# Patient Record
Sex: Female | Born: 1945 | Race: White | Hispanic: No | Smoking: Never smoker
Health system: Southern US, Community
[De-identification: ages and names within clinical notes are randomized; demographics above are authoritative.]

## PROBLEM LIST (undated history)

## (undated) DIAGNOSIS — F329 Major depressive disorder, single episode, unspecified: Secondary | ICD-10-CM

## (undated) DIAGNOSIS — E785 Hyperlipidemia, unspecified: Secondary | ICD-10-CM

## (undated) DIAGNOSIS — J189 Pneumonia, unspecified organism: Secondary | ICD-10-CM

## (undated) DIAGNOSIS — C801 Malignant (primary) neoplasm, unspecified: Secondary | ICD-10-CM

## (undated) DIAGNOSIS — M1990 Unspecified osteoarthritis, unspecified site: Secondary | ICD-10-CM

## (undated) DIAGNOSIS — I700 Atherosclerosis of aorta: Secondary | ICD-10-CM

## (undated) DIAGNOSIS — Z8719 Personal history of other diseases of the digestive system: Secondary | ICD-10-CM

## (undated) DIAGNOSIS — R0600 Dyspnea, unspecified: Secondary | ICD-10-CM

## (undated) DIAGNOSIS — I10 Essential (primary) hypertension: Secondary | ICD-10-CM

## (undated) DIAGNOSIS — E782 Mixed hyperlipidemia: Secondary | ICD-10-CM

## (undated) DIAGNOSIS — F32A Depression, unspecified: Secondary | ICD-10-CM

## (undated) DIAGNOSIS — H25019 Cortical age-related cataract, unspecified eye: Secondary | ICD-10-CM

## (undated) DIAGNOSIS — G4730 Sleep apnea, unspecified: Secondary | ICD-10-CM

## (undated) DIAGNOSIS — Z87442 Personal history of urinary calculi: Secondary | ICD-10-CM

## (undated) DIAGNOSIS — K219 Gastro-esophageal reflux disease without esophagitis: Secondary | ICD-10-CM

## (undated) DIAGNOSIS — K760 Fatty (change of) liver, not elsewhere classified: Secondary | ICD-10-CM

## (undated) DIAGNOSIS — J45909 Unspecified asthma, uncomplicated: Secondary | ICD-10-CM

## (undated) HISTORY — PX: NASAL SINUS SURGERY: SHX719

## (undated) HISTORY — PX: NISSEN FUNDOPLICATION: SHX2091

## (undated) HISTORY — PX: ABDOMINAL HYSTERECTOMY: SHX81

## (undated) HISTORY — PX: TOTAL KNEE ARTHROPLASTY: SHX125

## (undated) HISTORY — PX: COLONOSCOPY: SHX174

## (undated) HISTORY — PX: CARPAL TUNNEL RELEASE: SHX101

## (undated) HISTORY — PX: ESOPHAGOGASTRODUODENOSCOPY: SHX1529

## (undated) HISTORY — PX: KNEE ARTHROSCOPY: SUR90

## (undated) HISTORY — PX: CHOLECYSTECTOMY: SHX55

---

## 1968-03-19 HISTORY — PX: VAGINAL HYSTERECTOMY: SHX2639

## 2004-04-13 ENCOUNTER — Ambulatory Visit: Payer: Self-pay | Admitting: Internal Medicine

## 2005-04-26 ENCOUNTER — Ambulatory Visit: Payer: Self-pay | Admitting: Internal Medicine

## 2005-09-26 ENCOUNTER — Ambulatory Visit: Payer: Self-pay | Admitting: Internal Medicine

## 2005-12-21 ENCOUNTER — Ambulatory Visit: Payer: Self-pay | Admitting: Unknown Physician Specialty

## 2006-03-19 HISTORY — PX: BREAST BIOPSY: SHX20

## 2006-03-27 ENCOUNTER — Ambulatory Visit: Payer: Self-pay | Admitting: General Surgery

## 2006-10-01 ENCOUNTER — Ambulatory Visit: Payer: Self-pay | Admitting: Nurse Practitioner

## 2006-10-08 ENCOUNTER — Ambulatory Visit: Payer: Self-pay | Admitting: Nurse Practitioner

## 2006-12-11 ENCOUNTER — Ambulatory Visit: Payer: Self-pay | Admitting: Orthopaedic Surgery

## 2007-04-28 ENCOUNTER — Ambulatory Visit: Payer: Self-pay | Admitting: General Surgery

## 2007-10-21 ENCOUNTER — Ambulatory Visit: Payer: Self-pay | Admitting: General Surgery

## 2008-05-25 ENCOUNTER — Ambulatory Visit: Payer: Self-pay | Admitting: Family Medicine

## 2008-08-02 IMAGING — US ULTRASOUND RIGHT BREAST
1 series · 17 of 25 positions shown · non-contrast
Comparison: none

REASON FOR EXAM: right breast density   US if needed
COMMENTS:

[Series 1: ultrasound right breast · 17 of 36 slices shown]
[im 1/36]
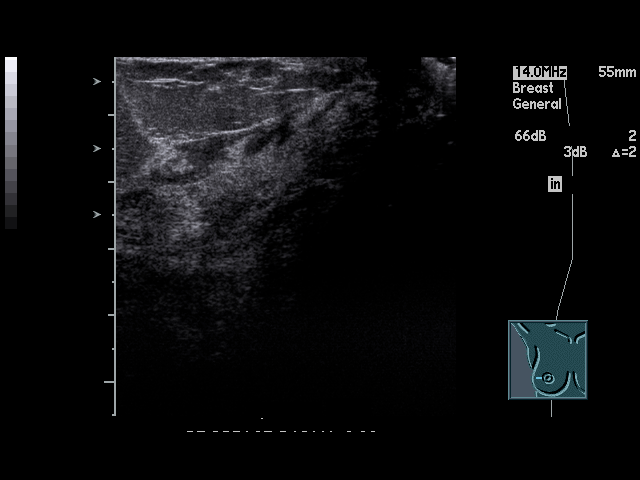
[im 3/36]
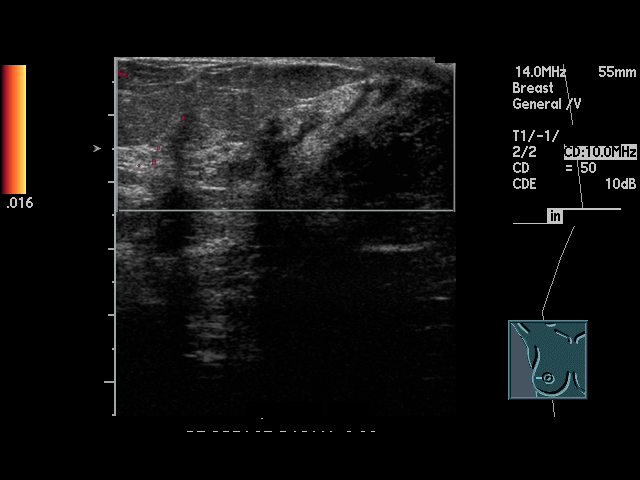
[im 5/36]
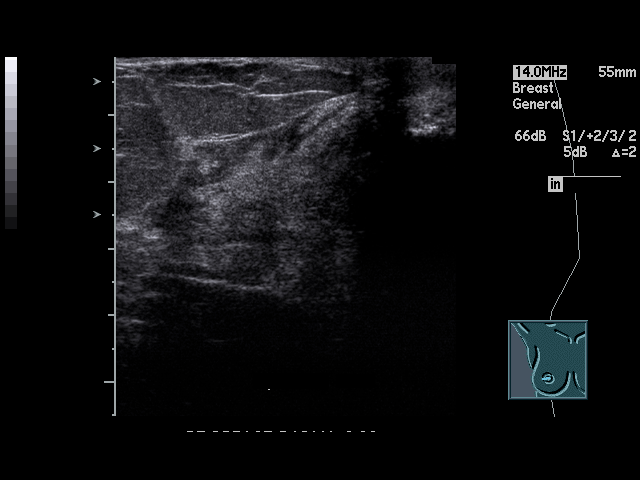
[im 8/36]
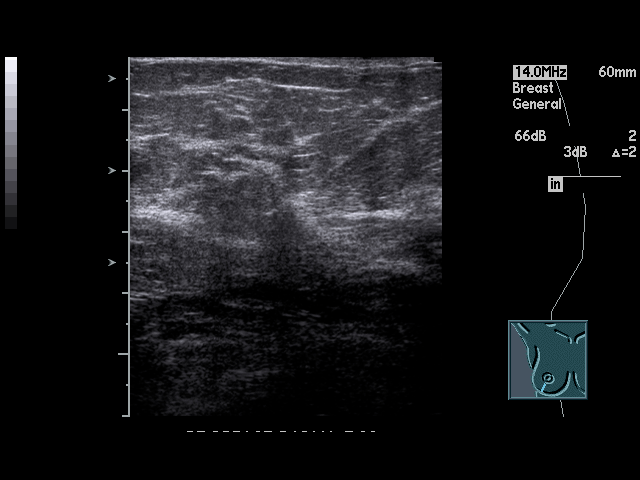
[im 9/36]
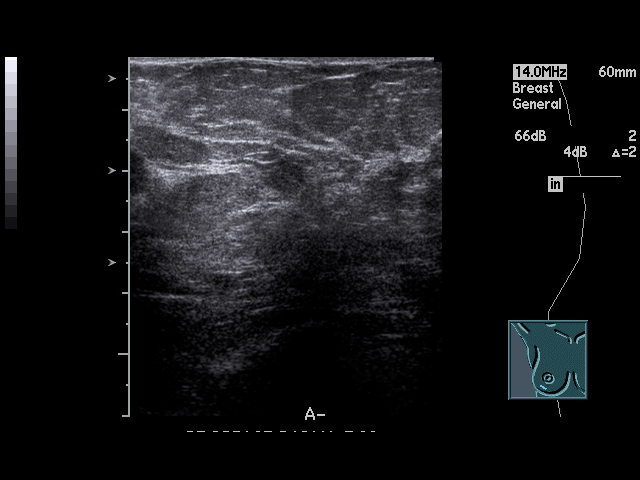
[im 12/36]
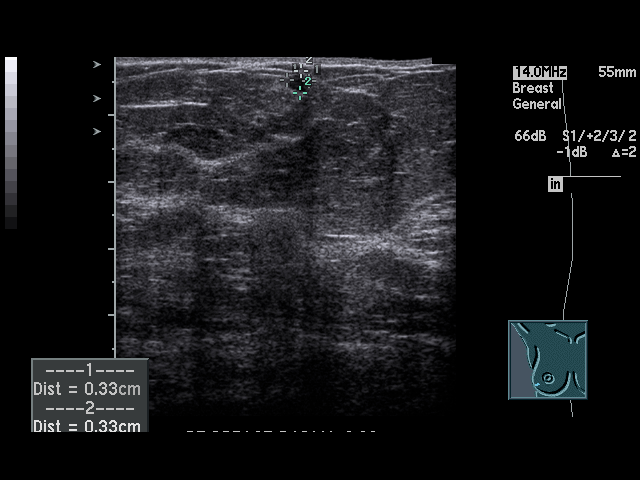
[im 14/36]
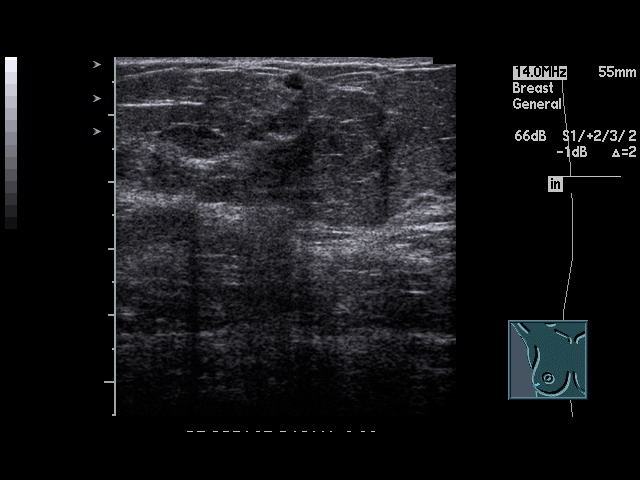
[im 17/36]
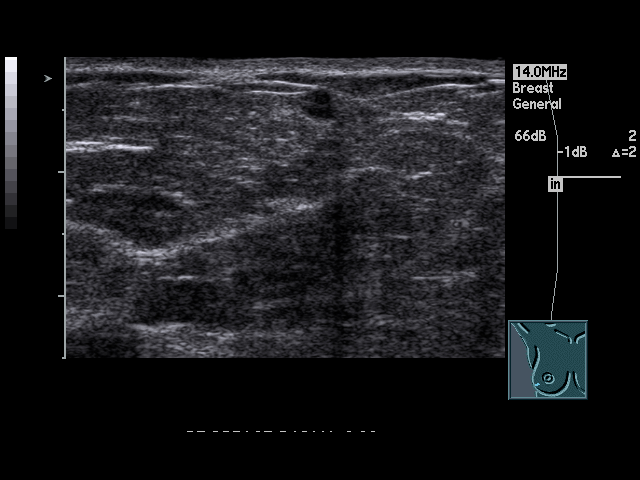
[im 18/36]
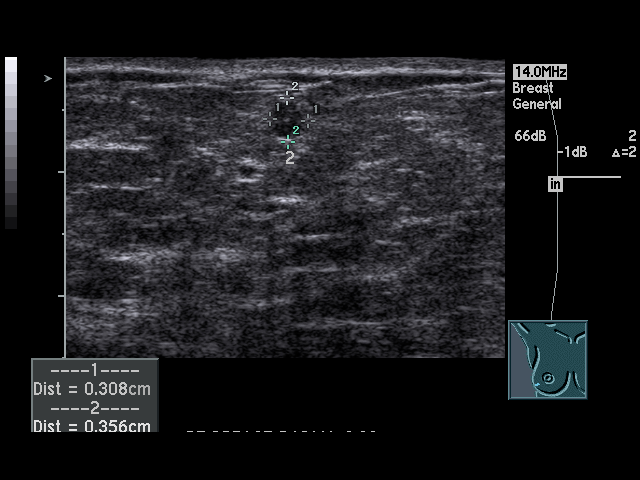
[im 19/36]
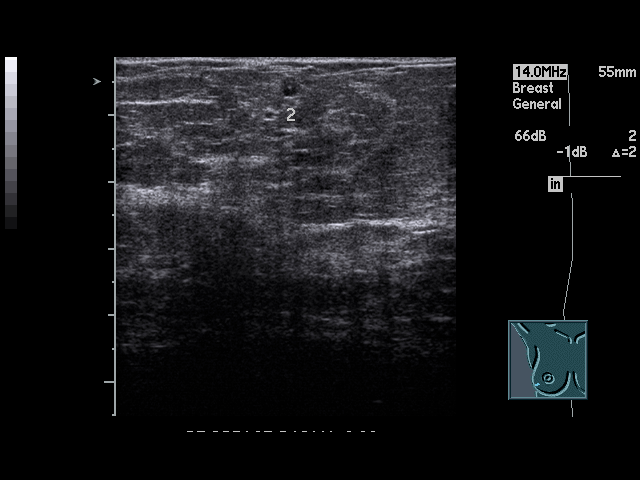
[im 22/36]
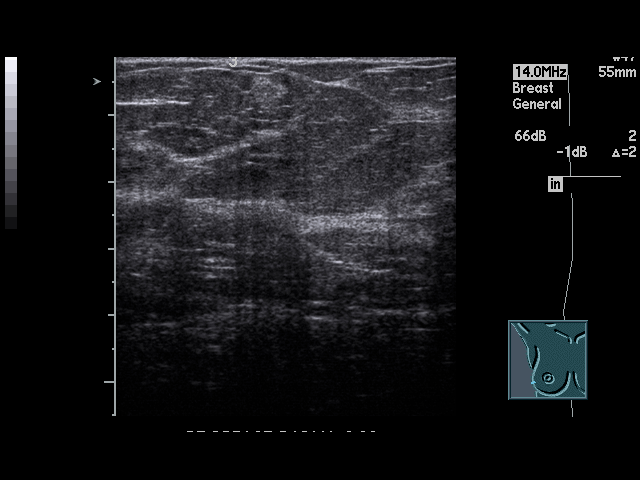
[im 24/36]
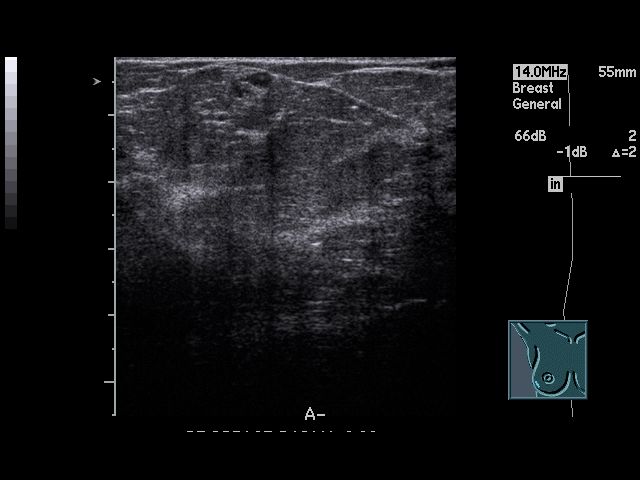
[im 27/36]
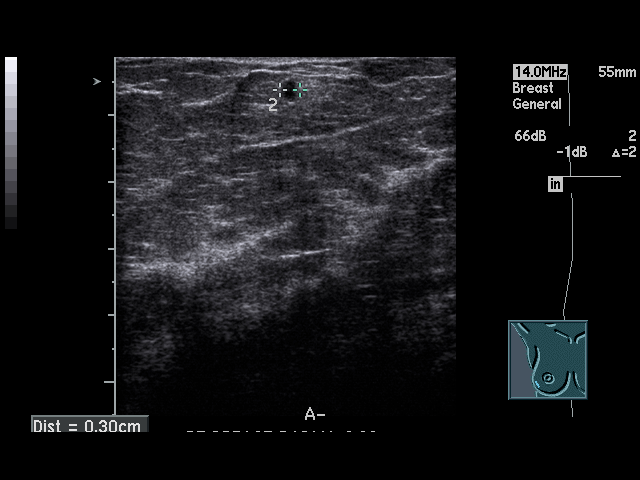
[im 28/36]
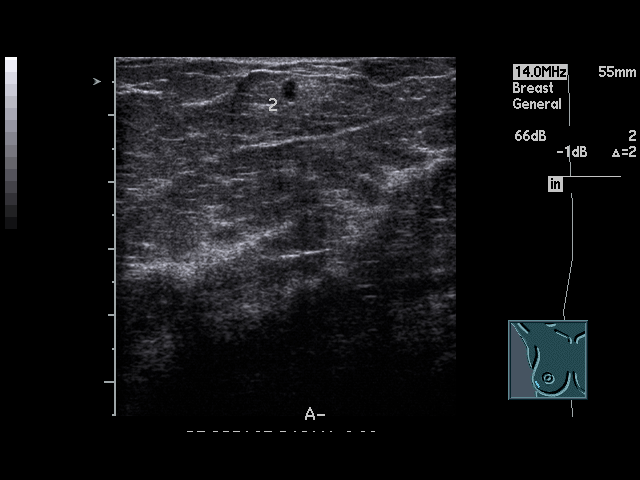
[im 31/36]
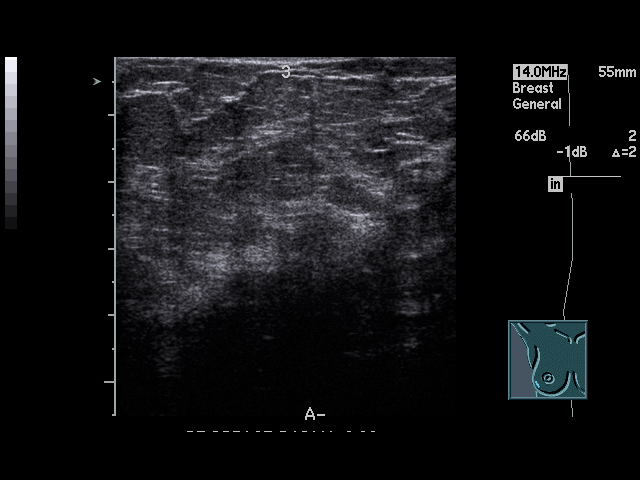
[im 33/36]
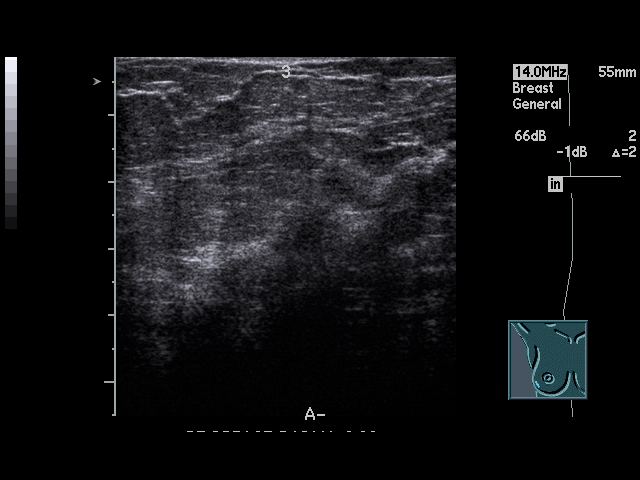
[im 36/36]
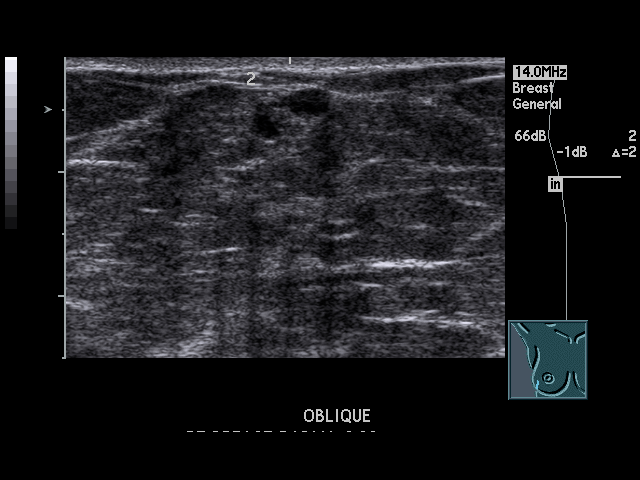

[17 of 25 positions shown; findings below may reference images not displayed]

PROCEDURE:     US  - US BREAST RIGHT  - October 08, 2006  [DATE]

RESULT:       The RIGHT breast was evaluated in the retroareolar region from
the 9 o'clock to the 7 o'clock position.  At the 8 o'clock position, a mixed
hypo and hyperechoic nodule is appreciated.  There appears to be three small
hypoechoic components of these nodules each measuring approximately 3.5 mm
in diameter. There appears to be flow appreciated with this region.  The
sonographic findings in this area are slightly ill-defined though suspicious
and surgical consultation is recommended.
IMPRESSION: Suspicious mixed hypo and echogenic nodules in the retroareolar portion of
the breast at 8 o'clock. Surgical consultation is recommended.

## 2008-10-25 ENCOUNTER — Ambulatory Visit: Payer: Self-pay | Admitting: Family Medicine

## 2008-11-08 ENCOUNTER — Ambulatory Visit: Payer: Self-pay | Admitting: Orthopedic Surgery

## 2009-10-31 ENCOUNTER — Ambulatory Visit: Payer: Self-pay | Admitting: Family Medicine

## 2010-03-13 ENCOUNTER — Ambulatory Visit: Payer: Self-pay | Admitting: Internal Medicine

## 2010-11-02 ENCOUNTER — Ambulatory Visit: Payer: Self-pay | Admitting: Family Medicine

## 2011-11-02 ENCOUNTER — Ambulatory Visit: Payer: Self-pay | Admitting: Family Medicine

## 2011-11-06 ENCOUNTER — Ambulatory Visit: Payer: Self-pay | Admitting: Family Medicine

## 2011-11-08 ENCOUNTER — Ambulatory Visit: Payer: Self-pay | Admitting: Family Medicine

## 2012-03-31 ENCOUNTER — Ambulatory Visit: Payer: Self-pay | Admitting: Unknown Physician Specialty

## 2012-05-13 ENCOUNTER — Ambulatory Visit: Payer: Self-pay | Admitting: Family Medicine

## 2012-07-19 ENCOUNTER — Ambulatory Visit: Payer: Self-pay | Admitting: Family Medicine

## 2012-07-25 ENCOUNTER — Ambulatory Visit: Payer: Self-pay | Admitting: Family Medicine

## 2013-05-26 DIAGNOSIS — K760 Fatty (change of) liver, not elsewhere classified: Secondary | ICD-10-CM | POA: Insufficient documentation

## 2013-07-21 ENCOUNTER — Ambulatory Visit: Payer: Self-pay | Admitting: Unknown Physician Specialty

## 2013-08-06 ENCOUNTER — Ambulatory Visit: Payer: Self-pay | Admitting: Family Medicine

## 2014-08-10 ENCOUNTER — Ambulatory Visit: Payer: Medicare PPO

## 2014-08-10 ENCOUNTER — Ambulatory Visit
Admission: EM | Admit: 2014-08-10 | Discharge: 2014-08-10 | Disposition: A | Discharge status: Home / Self Care | Payer: Medicare PPO | Attending: Family Medicine | Admitting: Family Medicine

## 2014-08-10 DIAGNOSIS — Z882 Allergy status to sulfonamides status: Secondary | ICD-10-CM | POA: Diagnosis not present

## 2014-08-10 DIAGNOSIS — J0100 Acute maxillary sinusitis, unspecified: Secondary | ICD-10-CM | POA: Insufficient documentation

## 2014-08-10 DIAGNOSIS — R0789 Other chest pain: Secondary | ICD-10-CM | POA: Diagnosis not present

## 2014-08-10 DIAGNOSIS — I10 Essential (primary) hypertension: Secondary | ICD-10-CM | POA: Diagnosis not present

## 2014-08-10 DIAGNOSIS — M940 Chondrocostal junction syndrome [Tietze]: Secondary | ICD-10-CM | POA: Diagnosis not present

## 2014-08-10 DIAGNOSIS — F329 Major depressive disorder, single episode, unspecified: Secondary | ICD-10-CM | POA: Diagnosis not present

## 2014-08-10 DIAGNOSIS — R51 Headache: Secondary | ICD-10-CM | POA: Diagnosis present

## 2014-08-10 DIAGNOSIS — R079 Chest pain, unspecified: Secondary | ICD-10-CM | POA: Insufficient documentation

## 2014-08-10 DIAGNOSIS — E785 Hyperlipidemia, unspecified: Secondary | ICD-10-CM | POA: Diagnosis not present

## 2014-08-10 HISTORY — DX: Essential (primary) hypertension: I10

## 2014-08-10 HISTORY — DX: Major depressive disorder, single episode, unspecified: F32.9

## 2014-08-10 HISTORY — DX: Depression, unspecified: F32.A

## 2014-08-10 HISTORY — DX: Hyperlipidemia, unspecified: E78.5

## 2014-08-10 LAB — CBC WITH DIFFERENTIAL/PLATELET
BASOS ABS: 0 10*3/uL (ref 0–0.1)
BASOS PCT: 1 %
EOS PCT: 3 %
Eosinophils Absolute: 0.2 10*3/uL (ref 0–0.7)
HCT: 41.4 % (ref 35.0–47.0)
Hemoglobin: 13.8 g/dL (ref 12.0–16.0)
LYMPHS ABS: 1 10*3/uL (ref 1.0–3.6)
Lymphocytes Relative: 17 %
MCH: 29.6 pg (ref 26.0–34.0)
MCHC: 33.3 g/dL (ref 32.0–36.0)
MCV: 89 fL (ref 80.0–100.0)
MONOS PCT: 10 %
Monocytes Absolute: 0.6 10*3/uL (ref 0.2–0.9)
NEUTROS PCT: 69 %
Neutro Abs: 4.3 10*3/uL (ref 1.4–6.5)
PLATELETS: 217 10*3/uL (ref 150–440)
RBC: 4.65 MIL/uL (ref 3.80–5.20)
RDW: 13.1 % (ref 11.5–14.5)
WBC: 6.1 10*3/uL (ref 3.6–11.0)

## 2014-08-10 LAB — CK TOTAL AND CKMB (NOT AT ARMC)
CK TOTAL: 39 U/L (ref 38–234)
CK, MB: 0.9 ng/mL (ref 0.5–5.0)
RELATIVE INDEX: INVALID (ref 0.0–2.5)

## 2014-08-10 LAB — COMPREHENSIVE METABOLIC PANEL
ALT: 21 U/L (ref 14–54)
AST: 21 U/L (ref 15–41)
Albumin: 4.1 g/dL (ref 3.5–5.0)
Alkaline Phosphatase: 62 U/L (ref 38–126)
Anion gap: 9 (ref 5–15)
BILIRUBIN TOTAL: 0.5 mg/dL (ref 0.3–1.2)
BUN: 19 mg/dL (ref 6–20)
CO2: 23 mmol/L (ref 22–32)
Calcium: 9.3 mg/dL (ref 8.9–10.3)
Chloride: 106 mmol/L (ref 101–111)
Creatinine, Ser: 0.67 mg/dL (ref 0.44–1.00)
GFR calc Af Amer: >60 mL/min (ref >60)
GFR calc non Af Amer: >60 mL/min (ref >60)
Glucose, Bld: 117 mg/dL — ABNORMAL HIGH (ref 65–99)
Potassium: 3.8 mmol/L (ref 3.5–5.1)
SODIUM: 138 mmol/L (ref 135–145)
Total Protein: 6.9 g/dL (ref 6.5–8.1)

## 2014-08-10 LAB — TROPONIN I: Troponin I: <0.03 ng/mL (ref <0.031)

## 2014-08-10 MED ORDER — FLUTICASONE PROPIONATE 50 MCG/ACT NA SUSP: 2.0000 | Freq: Every day | NASAL | Status: DC

## 2014-08-10 MED ORDER — KETOROLAC TROMETHAMINE 60 MG/2ML IM SOLN
60.0000 mg | Freq: Once | INTRAMUSCULAR | Status: AC
  Administered 2014-08-10: 60 mg via INTRAMUSCULAR

## 2014-08-10 MED ORDER — FEXOFENADINE-PSEUDOEPHED ER 180-240 MG PO TB24: 1.0000 | ORAL_TABLET | Freq: Every day | ORAL | Status: DC

## 2014-08-10 MED ORDER — LEVOFLOXACIN 500 MG PO TABS: 500.0000 mg | ORAL_TABLET | Freq: Every day | ORAL | Status: DC

## 2014-08-10 MED ORDER — MELOXICAM 15 MG PO TABS: 15.0000 mg | ORAL_TABLET | Freq: Every day | ORAL | Status: DC

## 2014-08-10 NOTE — Discharge Instructions (Signed)
If chest pain recurs or continues please go to the nearest emergency room of your choice for further evaluation. Would recommend strongly in 1-2 weeks see PCP and get scheduled for a stress test and possible echocardiogram just to make sure there is no signs of any significant cardiac disease even though all the tests here were negative. Chest Pain (Nonspecific) It is often hard to give a diagnosis for the cause of chest pain. There is always a chance that your pain could be related to something serious, such as a heart attack or a blood clot in the lungs. You need to follow up with your doctor. HOME CARE  If antibiotic medicine was given, take it as directed by your doctor. Finish the medicine even if you start to feel better.  For the next few days, avoid activities that bring on chest pain. Continue physical activities as told by your doctor.  Do not use any tobacco products. This includes cigarettes, chewing tobacco, and e-cigarettes.  Avoid drinking alcohol.  Only take medicine as told by your doctor.  Follow your doctor's suggestions for more testing if your chest pain does not go away.  Keep all doctor visits you made. GET HELP IF:  Your chest pain does not go away, even after treatment.  You have a rash with blisters on your chest.  You have a fever. GET HELP RIGHT AWAY IF:   You have more pain or pain that spreads to your arm, neck, jaw, back, or belly (abdomen).  You have shortness of breath.  You cough more than usual or cough up blood.  You have very bad back or belly pain.  You feel sick to your stomach (nauseous) or throw up (vomit).  You have very bad weakness.  You pass out (faint).  You have chills. This is an emergency. Do not wait to see if the problems will go away. Call your local emergency services (911 in [place] Do not drive yourself to the hospital. MAKE SURE YOU:   Understand these instructions.  Will watch your condition.  Will get help right  away if you are not doing well or get worse. Document Released: 08/22/2007 Document Revised: 03/10/2013 Document Reviewed: 08/22/2007 [facility] Patient Information 2015 [place], [place]. This information is not intended to replace advice given to you by your health care provider. Make sure you discuss any questions you have with your health care provider.  Chest Wall Pain Chest wall pain is pain felt in or around the chest bones and muscles. It may take up to 6 weeks to get better. It may take longer if you are active. Chest wall pain can happen on its own. Other times, things like germs, injury, coughing, or exercise can cause the pain. HOME CARE   Avoid activities that make you tired or cause pain. Try not to use your chest, belly (abdominal), or side muscles. Do not use heavy weights.  Put ice on the sore area.  Put ice in a plastic bag.  Place a towel between your skin and the bag.  Leave the ice on for 15-20 minutes for the first 2 days.  Only take medicine as told by your doctor. GET HELP RIGHT AWAY IF:   You have more pain or are very uncomfortable.  You have a fever.  Your chest pain gets worse.  You have new problems.  You feel sick to your stomach (nauseous) or throw up (vomit).  You start to sweat or feel lightheaded.  You have a  cough with mucus (phlegm).  You cough up blood. MAKE SURE YOU:   Understand these instructions.  Will watch your condition.  Will get help right away if you are not doing well or get worse. Document Released: 08/22/2007 Document Revised: 05/28/2011 Document Reviewed: 10/30/2010 [facility] Patient Information 2015 [place], [place]. This information is not intended to replace advice given to you by your health care provider. Make sure you discuss any questions you have with your health care provider. Sinusitis Sinusitis is redness, soreness, and puffiness (inflammation) of the air pockets in the bones of your face (sinuses). The redness,  soreness, and puffiness can cause air and mucus to get trapped in your sinuses. This can allow germs to grow and cause an infection.  HOME CARE   Drink enough fluids to keep your pee (urine) clear or pale yellow.  Use a humidifier in your home.  Run a hot shower to create steam in the bathroom. Sit in the bathroom with the door closed. Breathe in the steam 3-4 times a day.  Put a warm, moist washcloth on your face 3-4 times a day, or as told by your doctor.  Use salt water sprays (saline sprays) to wet the thick fluid in your nose. This can help the sinuses drain.  Only take medicine as told by your doctor. GET HELP RIGHT AWAY IF:   Your pain gets worse.  You have very bad headaches.  You are sick to your stomach (nauseous).  You throw up (vomit).  You are very sleepy (drowsy) all the time.  Your face is puffy (swollen).  Your vision changes.  You have a stiff neck.  You have trouble breathing. MAKE SURE YOU:   Understand these instructions.  Will watch your condition.  Will get help right away if you are not doing well or get worse. Document Released: 08/22/2007 Document Revised: 11/28/2011 Document Reviewed: 10/09/2011 [facility] Patient Information 2015 [place], [place]. This information is not intended to replace advice given to you by your health care provider. Make sure you discuss any questions you have with your health care provider.

## 2014-08-10 NOTE — ED Notes (Signed)
Complaints of left face pain x 1 week and chest pain x 1 week. Face pain describes as pressure pain, constant, no vision changes from baseline. CP localized in center chest, non-tender on palpation, non-radiating, no SOB, but movement makes it worst.

## 2014-08-10 NOTE — ED Provider Notes (Signed)
CSN: [id number]     Arrival date & time 08/10/14  0076 History   First MD Initiated Contact with Patient 08/10/14 [phone]     Chief Complaint  Patient presents with  . Facial Pain  . Chest Pain   (Consider location/radiation/quality/duration/timing/severity/associated sxs/prior Treatment) Patient is a 69 y.o. female presenting with chest pain and URI. The history is provided by the patient. No language interpreter was used.  Chest Pain Pain location:  Substernal area Pain quality: sharp and stabbing   Pain quality: not burning, not crushing, no pressure, not radiating, not shooting and no tightness   Pain radiates to:  Does not radiate Pain radiates to the back: no   Pain severity:  Severe Onset quality:  Sudden Duration:  1 week Timing:  Sporadic Progression:  Waxing and waning Chronicity:  New Context: breathing, lifting and movement   Relieved by:  Certain positions Worsened by:  Deep breathing and movement Ineffective treatments:  None tried Associated symptoms: cough   Associated symptoms comment:  She reports having sinus trouble for almost a month. Initially green discharge from nostril now clear with feels as if it socked in on the left side. Risk factors: hypertension   Risk factors: no birth control, no coronary artery disease and not female   URI Presenting symptoms: cough, facial pain and rhinorrhea   Severity:  Severe Onset quality: About one month. Timing:  Constant Progression:  Worsening Chronicity:  New Relieved by:  Nothing Ineffective treatments:  None tried Associated symptoms: sinus pain and sneezing   Risk factors: being elderly    reports family history of heart disease and a stress test that she had done about 2 years ago which was unremarkable other than her being a poor physical shape.  Past Medical History  Diagnosis Date  . Hypertension   . Hyperlipidemia   . Depression    Past Surgical History  Procedure Laterality Date  . Abdominal  hysterectomy    . Cholecystectomy    . Nasal sinus surgery    . Total knee arthroplasty     History reviewed. No pertinent family history. History  Substance Use Topics  . Smoking status: Never Smoker   . Smokeless tobacco: Not on file  . Alcohol Use: No   OB History    No data available     Review of Systems  HENT: Positive for rhinorrhea and sneezing.   Respiratory: Positive for cough.   Cardiovascular: Positive for chest pain.  All other systems reviewed and are negative.   Allergies  Doxycycline; Keflex; Penicillins; and Sulfa antibiotics  Home Medications   Prior to Admission medications   Medication Sig Start Date End Date Taking? Authorizing Provider  atorvastatin (LIPITOR) 10 MG tablet Take 10 mg by mouth daily.   Yes Historical Provider, MD  DULoxetine (CYMBALTA) 60 MG capsule Take 60 mg by mouth daily.   Yes Historical Provider, MD  hydrALAZINE (APRESOLINE) 25 MG tablet Take 25 mg by mouth 3 (three) times daily.   Yes Historical Provider, MD  losartan (COZAAR) 100 MG tablet Take 100 mg by mouth daily.   Yes Historical Provider, MD  traZODone (DESYREL) 150 MG tablet Take by mouth at bedtime.   Yes Historical Provider, MD  fexofenadine-pseudoephedrine (ALLEGRA-D ALLERGY & CONGESTION) 180-240 MG per 24 hr tablet Take 1 tablet by mouth daily. 08/10/14   Frederich Cha, MD  fluticasone (FLONASE) 50 MCG/ACT nasal spray Place 2 sprays into both nostrils daily. 08/10/14   Frederich Cha, MD  levofloxacin (  LEVAQUIN) 500 MG tablet Take 1 tablet (500 mg total) by mouth daily. 08/10/14   Frederich Cha, MD  meloxicam (MOBIC) 15 MG tablet Take 1 tablet (15 mg total) by mouth daily. 08/10/14   Frederich Cha, MD   BP 138/66 mmHg  Pulse 74  Temp(Src) 97.7 F (36.5 C) (Tympanic)  Resp 18  Ht 5\' 5"  (1.651 m)  Wt 263 lb (119.296 kg)  BMI 43.77 kg/m2  SpO2 96% Physical Exam  Constitutional: She is oriented to person, place, and time. She appears well-developed and well-nourished.  Obese  white female in no distress  HENT:  Head: Normocephalic.  Eyes: Pupils are equal, round, and reactive to light.  Neck: Normal range of motion. Neck supple.  Cardiovascular: Normal rate, regular rhythm, S1 normal and S2 normal.  Exam reveals no distant heart sounds and no friction rub.   No murmur heard. Pulmonary/Chest: Effort normal and breath sounds normal. No respiratory distress. She has no wheezes. She exhibits tenderness and bony tenderness. She exhibits no laceration.    Abdominal: Soft.  Musculoskeletal: Normal range of motion. She exhibits no edema.  Neurological: She is alert and oriented to person, place, and time.  Skin: Skin is warm.  Psychiatric: She has a normal mood and affect.   EKG was obtained which showed normal sinus rhythm at a ventricular rate of 67. ED Course  Procedures (including critical care time) Labs Review Labs Reviewed  COMPREHENSIVE METABOLIC PANEL - Abnormal; Notable for the following:    Glucose, Bld 117 (*)    All other components within normal limits  CBC WITH DIFFERENTIAL/PLATELET  CK TOTAL AND CKMB  TROPONIN I   Results for orders placed or performed during the hospital encounter of 08/10/14  Comprehensive metabolic panel  Result Value Ref Range   Sodium 138 135 - 145 mmol/L   Potassium 3.8 3.5 - 5.1 mmol/L   Chloride 106 101 - 111 mmol/L   CO2 23 22 - 32 mmol/L   Glucose, Bld 117 (H) 65 - 99 mg/dL   BUN 19 6 - 20 mg/dL   Creatinine, Ser 0.67 0.44 - 1.00 mg/dL   Calcium 9.3 8.9 - 10.3 mg/dL   Total Protein 6.9 6.5 - 8.1 g/dL   Albumin 4.1 3.5 - 5.0 g/dL   AST 21 15 - 41 U/L   ALT 21 14 - 54 U/L   Alkaline Phosphatase 62 38 - 126 U/L   Total Bilirubin 0.5 0.3 - 1.2 mg/dL   GFR calc non Af Amer >60 >60 mL/min   GFR calc Af Amer >60 >60 mL/min   Anion gap 9 5 - 15  CBC with Differential  Result Value Ref Range   WBC 6.1 3.6 - 11.0 K/uL   RBC 4.65 3.80 - 5.20 MIL/uL   Hemoglobin 13.8 12.0 - 16.0 g/dL   HCT 41.4 35.0 - 47.0 %    MCV 89.0 80.0 - 100.0 fL   MCH 29.6 26.0 - 34.0 pg   MCHC 33.3 32.0 - 36.0 g/dL   RDW 13.1 11.5 - 14.5 %   Platelets 217 150 - 440 K/uL   Neutrophils Relative % 69 %   Neutro Abs 4.3 1.4 - 6.5 K/uL   Lymphocytes Relative 17 %   Lymphs Abs 1.0 1.0 - 3.6 K/uL   Monocytes Relative 10 %   Monocytes Absolute 0.6 0.2 - 0.9 K/uL   Eosinophils Relative 3 %   Eosinophils Absolute 0.2 0 - 0.7 K/uL   Basophils Relative 1 %  Basophils Absolute 0.0 0 - 0.1 K/uL  CK total and CKMB (cardiac)  Result Value Ref Range   Total CK 39 38 - 234 U/L   CK, MB 0.9 0.5 - 5.0 ng/mL   Relative Index RELATIVE INDEX IS INVALID 0.0 - 2.5  Troponin I  Result Value Ref Range   Troponin I <0.03 <0.031 ng/mL    Imaging Review Dg Chest 2 View  08/10/2014   CLINICAL DATA:  One week history of right upper anterior chest pain with productive cough  EXAM: CHEST  2 VIEW  COMPARISON:  None.  FINDINGS: There is no edema or consolidation. Heart size and pulmonary vascularity are normal. No adenopathy. There is mild degenerative change in the thoracic spine.  IMPRESSION: No edema or consolidation.   Electronically Signed   By: Lowella Grip III M.D.   On: 08/10/2014 09:39   as I discussed with patient this appears to be costochondritis. I have also placed the patient that if this had not been ongoing for week previously normal would have sent her to the ED for full cardiac evaluation. Toradol injection offered to see if the pain was improved. MDM   1. Other chest pain   2. Costochondritis, acute   3. Acute maxillary sinusitis, recurrence not specified        Frederich Cha, MD 08/10/14 1539

## 2014-08-27 DIAGNOSIS — I10 Essential (primary) hypertension: Secondary | ICD-10-CM | POA: Insufficient documentation

## 2015-01-10 ENCOUNTER — Other Ambulatory Visit: Payer: Self-pay | Admitting: Family Medicine

## 2015-01-10 DIAGNOSIS — Z1231 Encounter for screening mammogram for malignant neoplasm of breast: Secondary | ICD-10-CM

## 2015-01-13 ENCOUNTER — Ambulatory Visit
Admission: RE | Admit: 2015-01-13 | Discharge: 2015-01-13 | Disposition: A | Discharge status: Home / Self Care | Payer: Medicare PPO | Source: Ambulatory Visit | Attending: Family Medicine | Admitting: Family Medicine

## 2015-01-13 DIAGNOSIS — Z1231 Encounter for screening mammogram for malignant neoplasm of breast: Secondary | ICD-10-CM | POA: Insufficient documentation

## 2015-05-16 IMAGING — CT CT ABD-PELV W/ CM
2 of 5 series · 17 of 46 positions shown, 19 images · IV contrast (isovue)
Comparison: CT ABD-PELV W/O CM dated 11/02/2011

CLINICAL DATA: Right flank pain.  History of kidney stones.

EXAM:
CT ABDOMEN AND PELVIS WITH CONTRAST
TECHNIQUE: Multidetector CT imaging of the abdomen and pelvis was performed
using the standard protocol following bolus administration of
intravenous contrast.
CONTRAST:  100 mL Isovue 370.

[Series 2: axial soft tissue · axial · 0.78mm/px · z∈[-512,-112]mm · 14 of 90 slices shown, 16 images]
[im 5/90  soft-tissue]
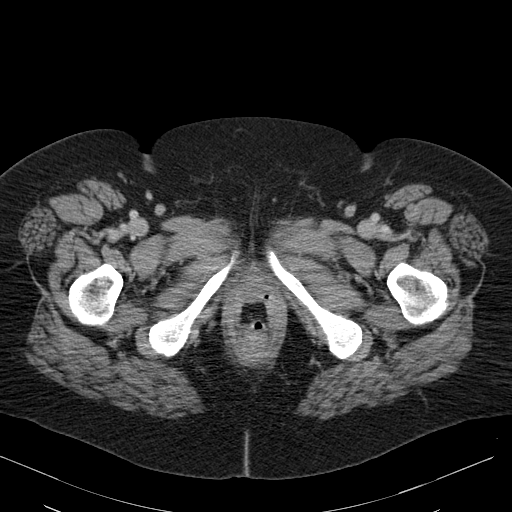
[im 5/90  bone]
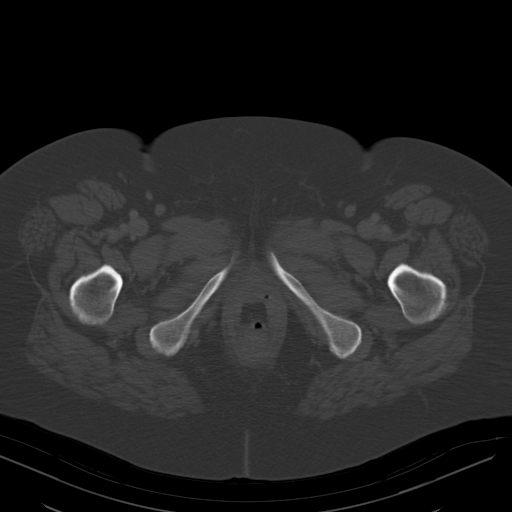
[im 14/90  soft-tissue]
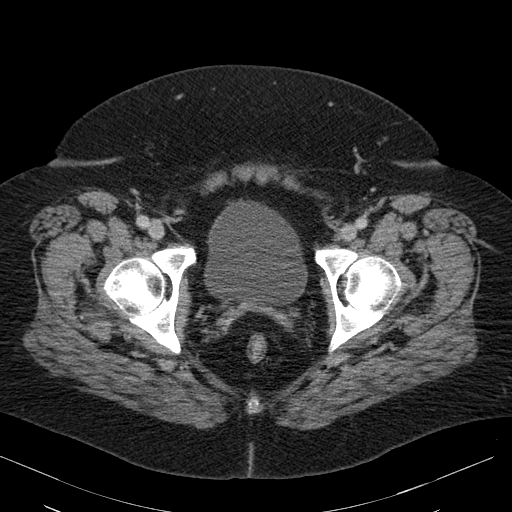
[im 18/90  soft-tissue]
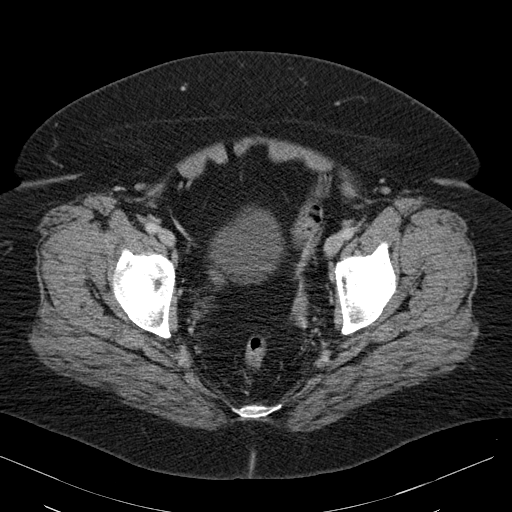
[im 23/90  soft-tissue]
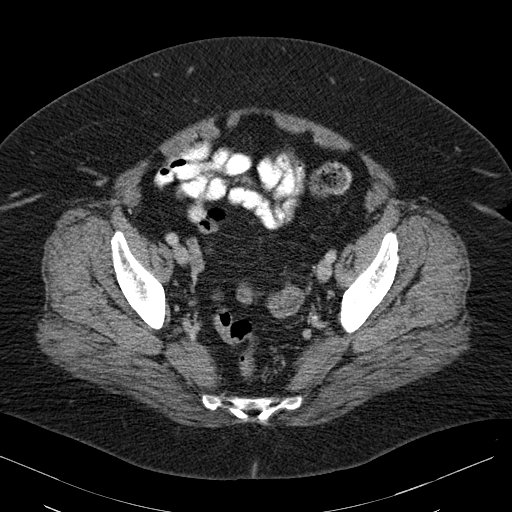
[im 32/90  soft-tissue]
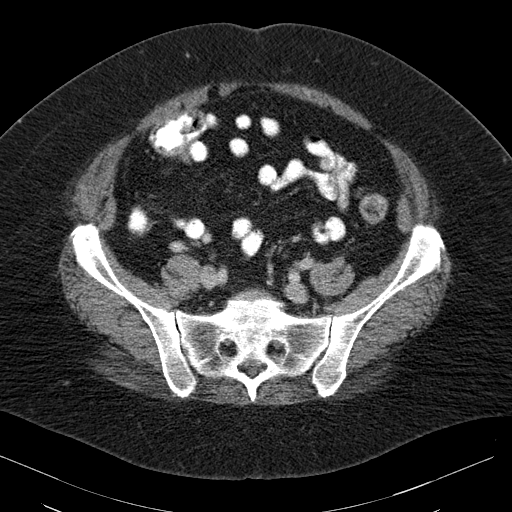
[im 36/90  soft-tissue]
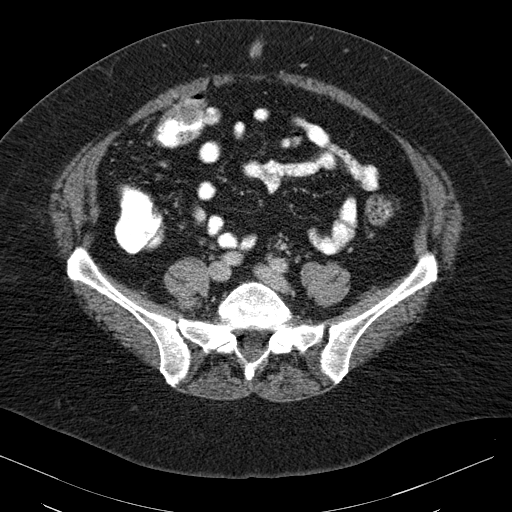
[im 41/90  soft-tissue]
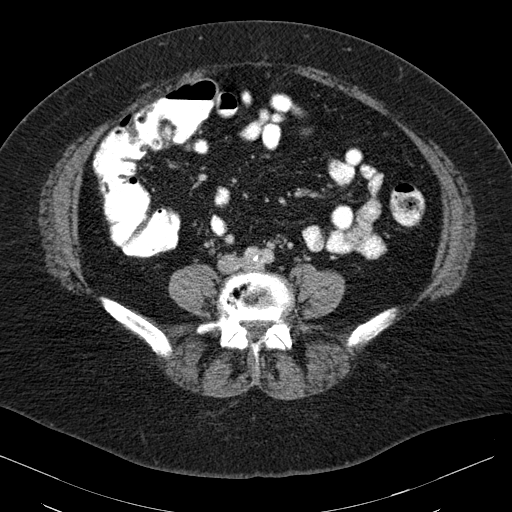
[im 49/90  soft-tissue]
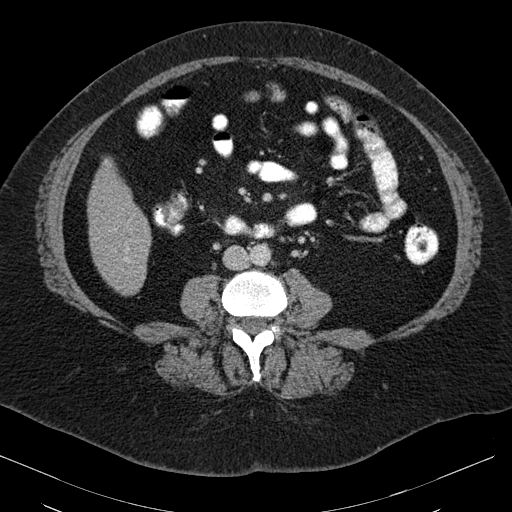
[im 54/90  soft-tissue]
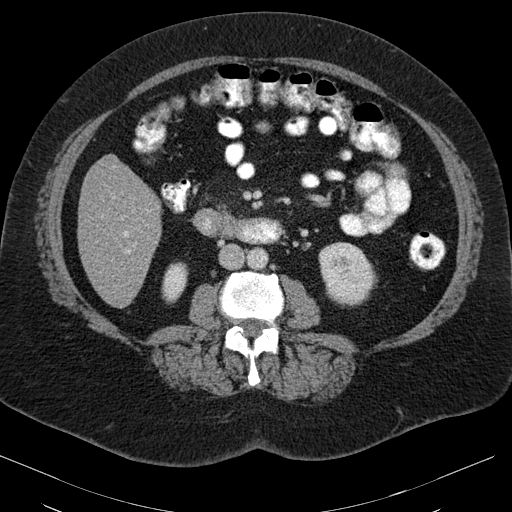
[im 54/90  bone]
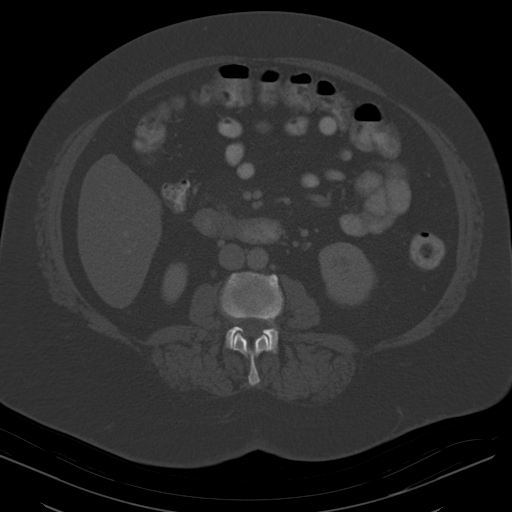
[im 58/90  soft-tissue]
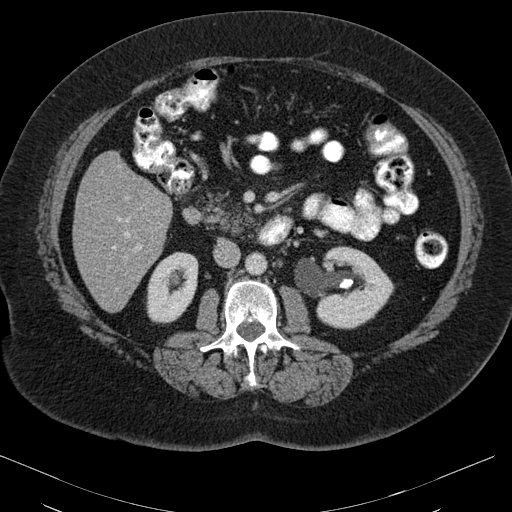
[im 67/90  soft-tissue]
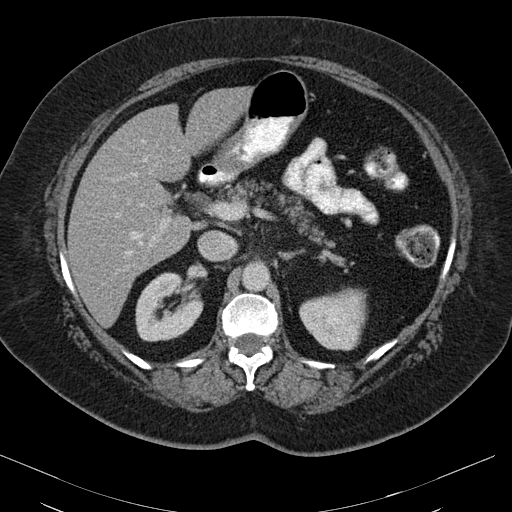
[im 72/90  soft-tissue]
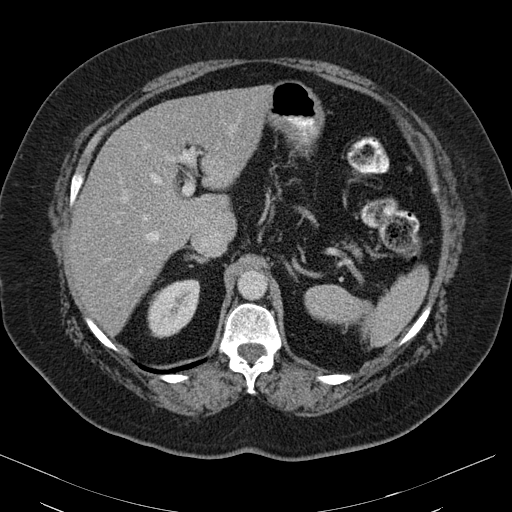
[im 76/90  soft-tissue]
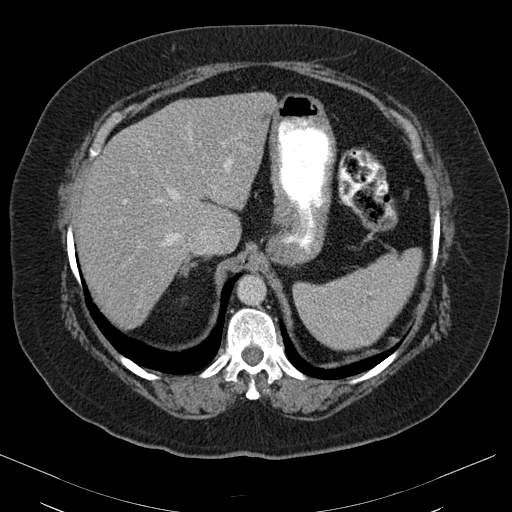
[im 85/90  soft-tissue]
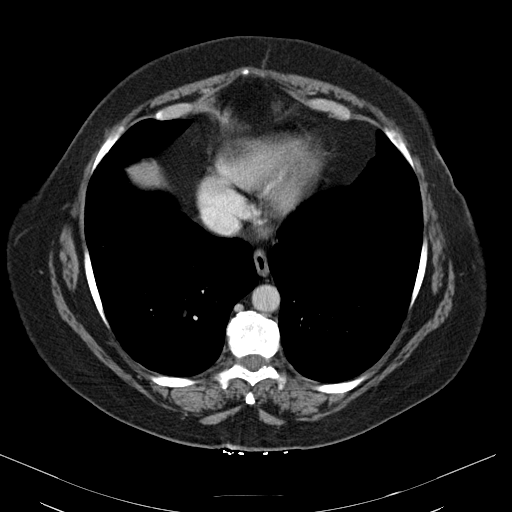

[Series 602: coronal · coronal · 0.88mm/px · 3 of 159 slices shown]
[im 53/159  soft-tissue]
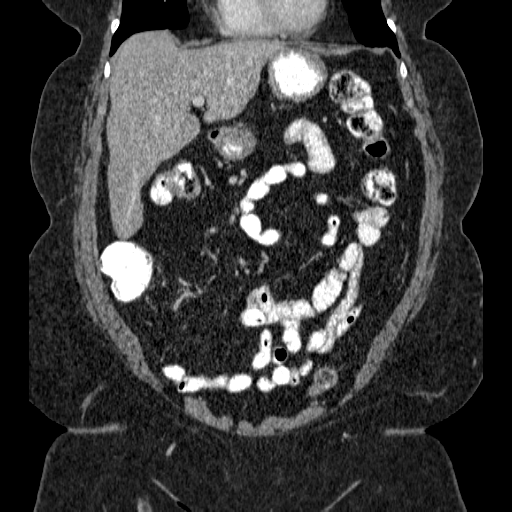
[im 71/159  soft-tissue]
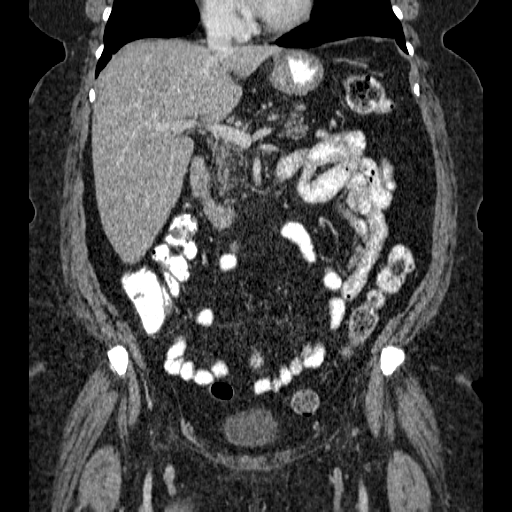
[im 88/159  soft-tissue]
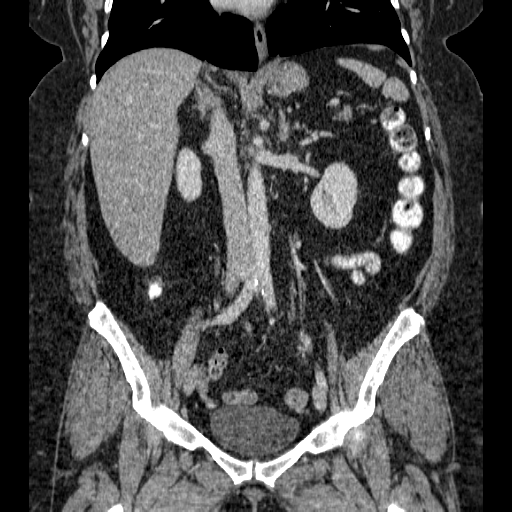

[17 of 46 positions shown; findings below may reference images not displayed]

FINDINGS: Bones:  No aggressive osseous lesions.  Lumbar spondylosis.

Lung Bases: Scattered areas of nonspecific subpleural ground-glass
attenuation without fibrosis. No focal consolidation.

Liver: 4 mm low-density lesion in the subcapsular left hepatic lobe
probably represents a cyst but is too small to characterize (image
16 series 2).

Spleen:  Normal.

Gallbladder:  Surgically absent.

Common bile duct:  Normal.

Pancreas:  Normal.

Adrenal glands:  Normal bilaterally.

Kidneys: Normal enhancement and delayed excretion of contrast. Right
ureter appears normal. Punctate right inferior renal pole collecting
system calculus measuring 2 mm, unchanged from prior.

The left kidney shows a nonobstructing interpolar calculus measuring
11 mm x 6 mm on axial imaging and 12 mm craniocaudal. Smaller
interpolar calculus measures 6 mm, also nonobstructing. No ureteral
calculi. Extrarenal pelvis incidentally noted.

Stomach:  Normal.

Small bowel:  Normal.  No mesenteric adenopathy or obstruction.

Colon:   Normal.

Pelvic Genitourinary: Hysterectomy. Urinary bladder appears normal.
No free fluid. Both ovaries appear normal for age.

Vasculature: Mild atherosclerosis.

Body Wall: Normal.
IMPRESSION: 1. Bilateral nonobstructing renal collecting system calculi with the
largest on the left measuring 12 mm long axis. Compared to prior, no
interval change.
2. Cholecystectomy and hysterectomy.

## 2015-11-18 ENCOUNTER — Ambulatory Visit
Admission: EM | Admit: 2015-11-18 | Discharge: 2015-11-18 | Disposition: A | Discharge status: Home / Self Care | Payer: Medicare Other | Attending: Emergency Medicine | Admitting: Emergency Medicine

## 2015-11-18 ENCOUNTER — Encounter: Payer: Self-pay | Admitting: Emergency Medicine

## 2015-11-18 DIAGNOSIS — H81399 Other peripheral vertigo, unspecified ear: Secondary | ICD-10-CM

## 2015-11-18 LAB — URINALYSIS COMPLETE WITH MICROSCOPIC (ARMC ONLY)
Bilirubin Urine: NEGATIVE
Glucose, UA: NEGATIVE mg/dL
Nitrite: NEGATIVE
Protein, ur: NEGATIVE mg/dL
Specific Gravity, Urine: 1.015 (ref 1.005–1.030)
pH: 5.5 (ref 5.0–8.0)

## 2015-11-18 MED ORDER — MECLIZINE HCL 25 MG PO TABS: 25.0000 mg | ORAL_TABLET | Freq: Three times a day (TID) | ORAL | 0 refills | Status: DC | PRN

## 2015-11-18 MED ORDER — DIAZEPAM 2 MG PO TABS: 2.0000 mg | ORAL_TABLET | Freq: Three times a day (TID) | ORAL | 0 refills | Status: DC | PRN

## 2015-11-18 NOTE — ED Triage Notes (Signed)
Patient c/o dizziness that started 2 weeks ago.

## 2015-11-18 NOTE — ED Provider Notes (Signed)
HPI  SUBJECTIVE:  Chelsea Burke is a 70 y.o. female who presents with  dizziness described as vertigo for the past 2 weeks. States that it lasts approximately 1 minute and then resolves. She denies it as being lightheaded or off balance. She states that it is present primarily with head movement and sometimes with leaning backward forward, and with large positional changes. It is better with being still. She has tried over-the-counter meclizine which she states did not help but "dried her out". She reports seconds long nausea with the vertigo. She also reports some nasal congestion, postnasal drip and states that her ears feel "stopped up", and reports some decreased hearing in her left ear, but denies vomiting, fevers, ear pain, tinnitus, change in medications, recent antibiotics. She takes aspirin 81 mg once daily she does not take any other NSAIDs. She denies chest pain, short of breath, palpitations, presyncope, syncope. No diarrhea. She denies facial droop, arm or leg weakness, dysarthria, recent viral syndrome. She states that she has had vertigo before and that this feels like her previous episodes. She also reports urinary urgency and frequency for the past year. States it is not new for the past 2 weeks. She denies dysuria, cloudy or odorous urine, hematuria, abdominal pain, pelvic pain, back pain, vaginal bleeding or discharge. She has past medical history of seasonal allergies which are present and year, nephrolithiasis, vertigo, hypertension, hypercholesterolemia, depression and a fall. No history of syncope, atrial fibrillation, arrhythmia, diabetes. PMD: Dr. Konrad Penta at [facility].  Past Medical History:  Diagnosis Date  . Depression   . Hyperlipidemia   . Hypertension     Past Surgical History:  Procedure Laterality Date  . ABDOMINAL HYSTERECTOMY    . BREAST BIOPSY Bilateral 03/2006   bil bx/clips-neg  . CHOLECYSTECTOMY    . NASAL SINUS SURGERY    . TOTAL KNEE  ARTHROPLASTY      History reviewed. No pertinent family history.  Social History  Substance Use Topics  . Smoking status: Never Smoker  . Smokeless tobacco: Never Used  . Alcohol use No    No current facility-administered medications for this encounter.   Current Outpatient Prescriptions:  .  atorvastatin (LIPITOR) 10 MG tablet, Take 10 mg by mouth daily., Disp: , Rfl:  .  diazepam (VALIUM) 2 MG tablet, Take 1 tablet (2 mg total) by mouth every 8 (eight) hours as needed (vertigo)., Disp: 15 tablet, Rfl: 0 .  DULoxetine (CYMBALTA) 60 MG capsule, Take 60 mg by mouth daily., Disp: , Rfl:  .  hydrALAZINE (APRESOLINE) 25 MG tablet, Take 25 mg by mouth 3 (three) times daily., Disp: , Rfl:  .  losartan (COZAAR) 100 MG tablet, Take 100 mg by mouth daily., Disp: , Rfl:  .  meclizine (ANTIVERT) 25 MG tablet, Take 1 tablet (25 mg total) by mouth 3 (three) times daily as needed for dizziness., Disp: 30 tablet, Rfl: 0 .  traZODone (DESYREL) 150 MG tablet, Take by mouth at bedtime., Disp: , Rfl:   Allergies  Allergen Reactions  . Doxycycline Anaphylaxis  . Keflex [Cephalexin] Rash  . Norvasc [Amlodipine] Rash  . Penicillins Rash  . Sulfa Antibiotics Rash     ROS  As noted in HPI.   Physical Exam  Ht 5\' 5"  (1.651 m)   Wt 260 lb (117.9 kg)   BMI 43.27 kg/m   Orthostatic VS for the past 24 hrs:  BP- Lying Pulse- Lying BP- Sitting Pulse- Sitting BP- Standing at 0 minutes  Pulse- Standing at 0 minutes  11/18/15 0848 129/66 80 130/73 75 133/66 74      Constitutional: Well developed, well nourished, no acute distress Eyes:  PERRLA EOMI, conjunctiva normal bilaterally HENT: Normocephalic, atraumatic,mucus membranes moist . TMs normal bilaterally. No effusion. No nasal congestion. Hearing grossly intact. Normal nares. No sinus tenderness Neck: No carotid bruit Respiratory: Normal inspiratory effort Cardiovascular: Normal rate regular rhythm, no murmurs rubs or gallops. Peripheral  pulses equal bilaterally GI: nondistended skin: No rash, skin intact Musculoskeletal: no deformities Neurologic: Alert & oriented x 3, cranial nerves II through XII intact as tested. Finger-nose, heel shin within normal limits. Romberg negative. Positive Dix-Hallpike on the right side with fatigable nystagmus. Psychiatric: Speech and behavior appropriate   ED Course   Medications - No data to display  Orders Placed This Encounter  Procedures  . Urine culture    Standing Status:   Standing    Number of Occurrences:   1    Order Specific Question:   Patient immune status    Answer:   Normal  . Urinalysis complete, with microscopic    Standing Status:   Standing    Number of Occurrences:   1  . Orthostatic vital signs    Standing Status:   Standing    Number of Occurrences:   1    Results for orders placed or performed during the hospital encounter of 11/18/15 (from the past 24 hour(s))  Urinalysis complete, with microscopic     Status: Abnormal   Collection Time: 11/18/15  8:40 AM  Result Value Ref Range   Color, Urine YELLOW YELLOW   APPearance HAZY (A) CLEAR   Glucose, UA NEGATIVE NEGATIVE mg/dL   Bilirubin Urine NEGATIVE NEGATIVE   Ketones, ur TRACE (A) NEGATIVE mg/dL   Specific Gravity, Urine 1.015 1.005 - 1.030   Hgb urine dipstick SMALL (A) NEGATIVE   pH 5.5 5.0 - 8.0   Protein, ur NEGATIVE NEGATIVE mg/dL   Nitrite NEGATIVE NEGATIVE   Leukocytes, UA TRACE (A) NEGATIVE   RBC / HPF 6-30 0 - 5 RBC/hpf   WBC, UA 0-5 0 - 5 WBC/hpf   Bacteria, UA FEW (A) NONE SEEN   Squamous Epithelial / LPF 0-5 (A) NONE SEEN   Mucous PRESENT    No results found.  ED Clinical Impression  Peripheral vertigo, unspecified laterality  ED Assessment/Plan  Orthostatics are negative. She has small hematuria and trace leukocytes and a few bacteria however negative nitrite. Discussed these results with patient. Advised that she will need to have her urine rechecked in a week to make sure  that hematuria clears. Will send urine off for culture before initiating antibiotic treatment as she has no new urinary complaints.  Presentation is most consistent with a right-sided peripheral vertigo. No evidence of a central vertigo or stroke. Performed Epley maneuver here with improvement in symptoms. Sending home with low-dose meclizine, small dose of Valium if the meclizine does not work. She'll follow-up with her doctor.  Discussed labs,  MDM, plan and followup with patient. Discussed sn/sx that should prompt return to the ED. Patient agrees with plan.   *This clinic note was created using Dragon dictation software. Therefore, there may be occasional mistakes despite careful proofreading.  ?   Melynda Ripple, MD 11/18/15 [phone]

## 2015-11-19 LAB — URINE CULTURE
CULTURE: NO GROWTH
SPECIAL REQUESTS: NORMAL

## 2016-06-04 IMAGING — CR DG CHEST 2V
2 series · 2 of 2 positions shown · non-contrast
Comparison: None.

CLINICAL DATA: One week history of right upper anterior chest pain
with productive cough

EXAM:
CHEST  2 VIEW

[chest pa]
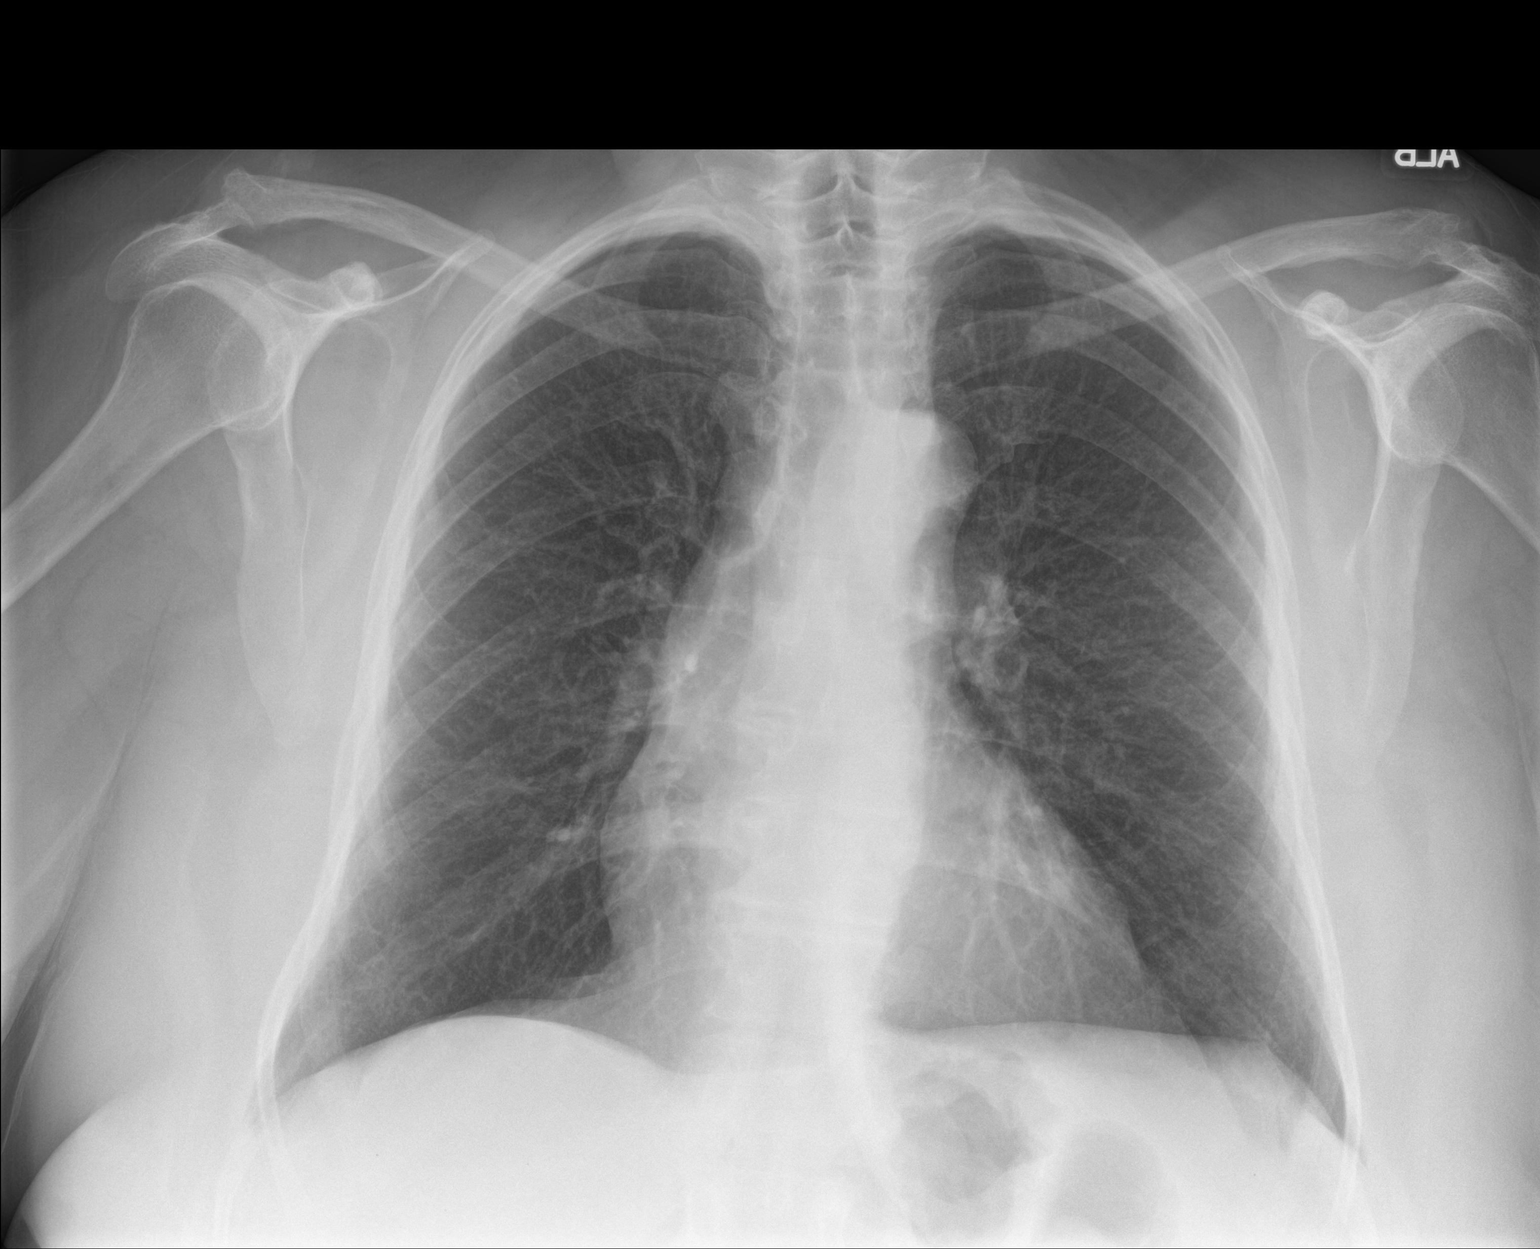

[chest lat]
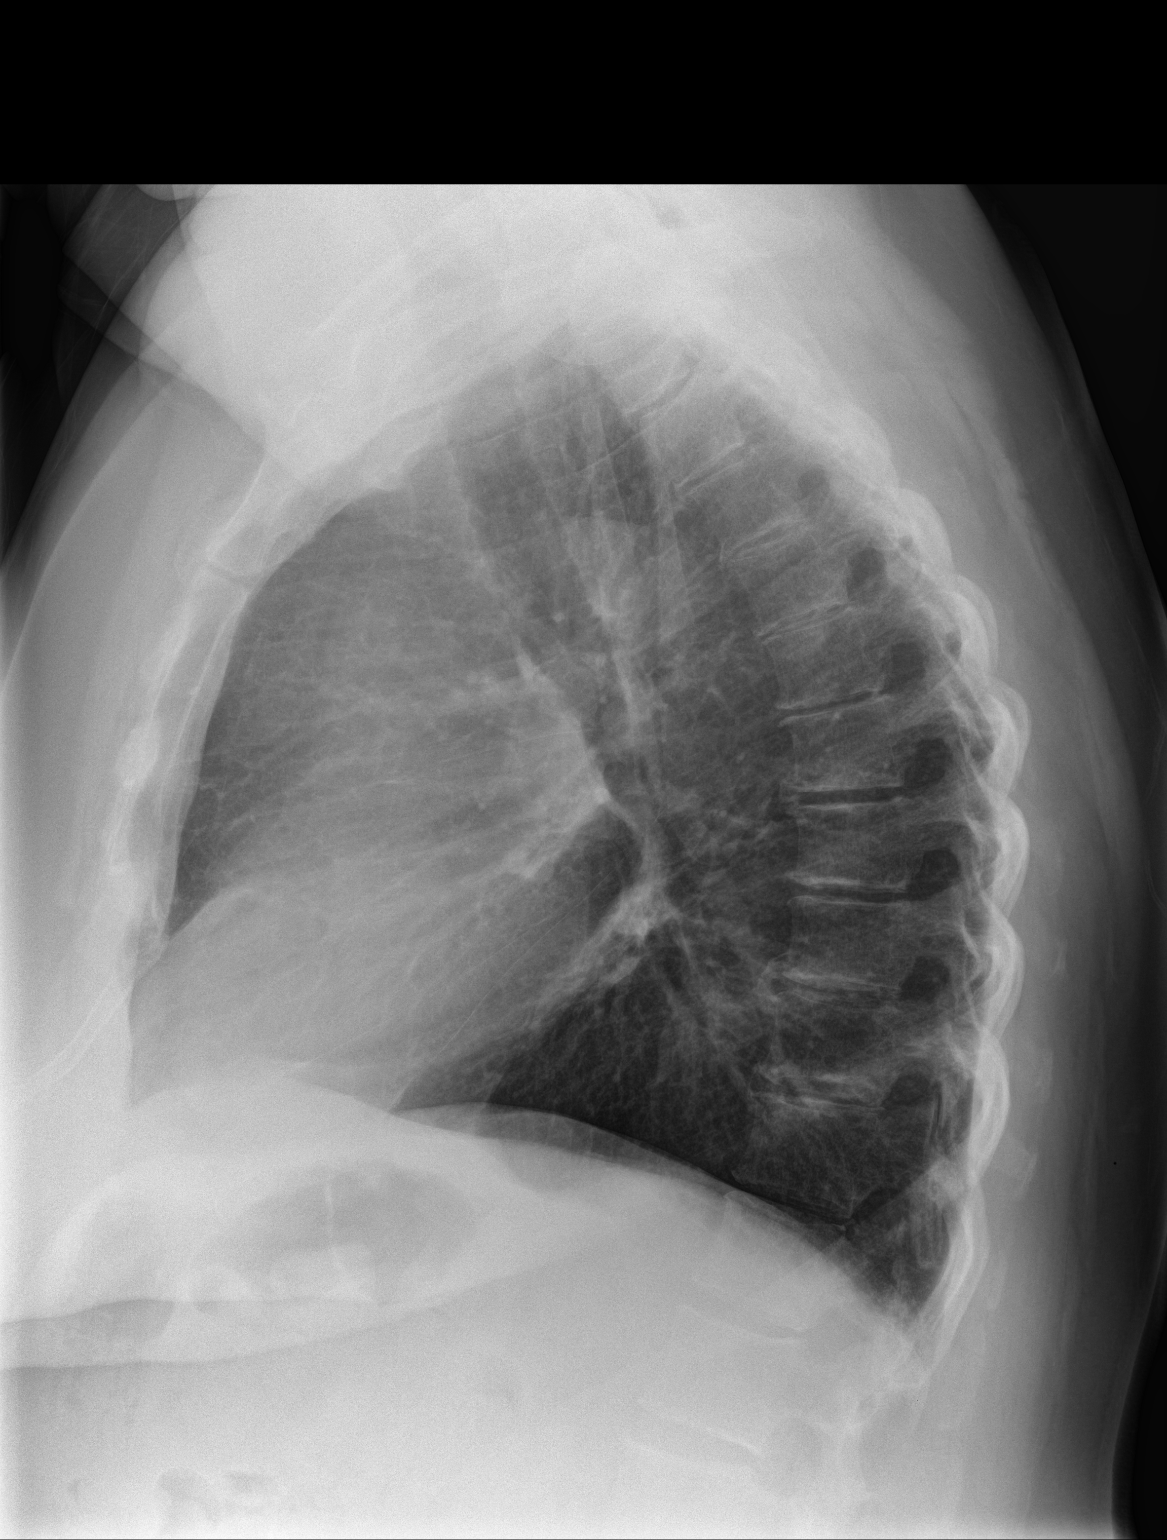

[2 of 2 positions shown; findings below may reference images not displayed]

FINDINGS: There is no edema or consolidation. Heart size and pulmonary
vascularity are normal. No adenopathy. There is mild degenerative
change in the thoracic spine.
IMPRESSION: No edema or consolidation.

## 2017-05-31 DIAGNOSIS — Z86010 Personal history of colonic polyps: Secondary | ICD-10-CM | POA: Insufficient documentation

## 2017-05-31 DIAGNOSIS — Z860101 Personal history of adenomatous and serrated colon polyps: Secondary | ICD-10-CM | POA: Insufficient documentation

## 2017-07-18 ENCOUNTER — Encounter: Payer: Self-pay | Admitting: *Deleted

## 2017-07-19 ENCOUNTER — Ambulatory Visit: Payer: Medicare Other | Admitting: Anesthesiology

## 2017-07-19 ENCOUNTER — Encounter
Admission: RE | Disposition: A | Discharge status: Home / Self Care | Payer: Self-pay | Source: Ambulatory Visit | Attending: Unknown Physician Specialty

## 2017-07-19 ENCOUNTER — Ambulatory Visit
Admission: RE | Admit: 2017-07-19 | Discharge: 2017-07-19 | Disposition: A | Discharge status: Home / Self Care | Payer: Medicare Other | Source: Ambulatory Visit | Attending: Unknown Physician Specialty | Admitting: Unknown Physician Specialty

## 2017-07-19 DIAGNOSIS — D124 Benign neoplasm of descending colon: Secondary | ICD-10-CM | POA: Insufficient documentation

## 2017-07-19 DIAGNOSIS — F329 Major depressive disorder, single episode, unspecified: Secondary | ICD-10-CM | POA: Diagnosis not present

## 2017-07-19 DIAGNOSIS — Z6841 Body Mass Index (BMI) 40.0 and over, adult: Secondary | ICD-10-CM | POA: Insufficient documentation

## 2017-07-19 DIAGNOSIS — K640 First degree hemorrhoids: Secondary | ICD-10-CM | POA: Insufficient documentation

## 2017-07-19 DIAGNOSIS — Z7982 Long term (current) use of aspirin: Secondary | ICD-10-CM | POA: Insufficient documentation

## 2017-07-19 DIAGNOSIS — K2950 Unspecified chronic gastritis without bleeding: Secondary | ICD-10-CM | POA: Diagnosis not present

## 2017-07-19 DIAGNOSIS — K760 Fatty (change of) liver, not elsewhere classified: Secondary | ICD-10-CM | POA: Diagnosis not present

## 2017-07-19 DIAGNOSIS — Z79899 Other long term (current) drug therapy: Secondary | ICD-10-CM | POA: Insufficient documentation

## 2017-07-19 DIAGNOSIS — I10 Essential (primary) hypertension: Secondary | ICD-10-CM | POA: Diagnosis not present

## 2017-07-19 DIAGNOSIS — K219 Gastro-esophageal reflux disease without esophagitis: Secondary | ICD-10-CM | POA: Diagnosis not present

## 2017-07-19 DIAGNOSIS — E785 Hyperlipidemia, unspecified: Secondary | ICD-10-CM | POA: Insufficient documentation

## 2017-07-19 DIAGNOSIS — Z86010 Personal history of colonic polyps: Secondary | ICD-10-CM | POA: Insufficient documentation

## 2017-07-19 DIAGNOSIS — Z1211 Encounter for screening for malignant neoplasm of colon: Secondary | ICD-10-CM | POA: Insufficient documentation

## 2017-07-19 HISTORY — DX: Unspecified osteoarthritis, unspecified site: M19.90

## 2017-07-19 HISTORY — DX: Personal history of other diseases of the digestive system: Z87.19

## 2017-07-19 HISTORY — DX: Gastro-esophageal reflux disease without esophagitis: K21.9

## 2017-07-19 HISTORY — PX: COLONOSCOPY WITH PROPOFOL: SHX5780

## 2017-07-19 HISTORY — DX: Dyspnea, unspecified: R06.00

## 2017-07-19 HISTORY — DX: Personal history of urinary calculi: Z87.442

## 2017-07-19 HISTORY — DX: Unspecified asthma, uncomplicated: J45.909

## 2017-07-19 HISTORY — PX: ESOPHAGOGASTRODUODENOSCOPY (EGD) WITH PROPOFOL: SHX5813

## 2017-07-19 HISTORY — DX: Fatty (change of) liver, not elsewhere classified: K76.0

## 2017-07-19 SURGERY — COLONOSCOPY WITH PROPOFOL
Anesthesia: General

## 2017-07-19 MED ORDER — SODIUM CHLORIDE 0.9 % IV SOLN: INTRAVENOUS | Status: DC

## 2017-07-19 MED ORDER — PROPOFOL 10 MG/ML IV BOLUS
INTRAVENOUS | Status: DC | PRN
  Administered 2017-07-19: 20 mg via INTRAVENOUS
  Administered 2017-07-19: 30 mg via INTRAVENOUS

## 2017-07-19 MED ORDER — GENTAMICIN IN SALINE 1-0.9 MG/ML-% IV SOLN
100.0000 mg | Freq: Once | INTRAVENOUS | Status: DC
  Filled 2017-07-19: qty 100

## 2017-07-19 MED ORDER — VANCOMYCIN HCL IN DEXTROSE 1-5 GM/200ML-% IV SOLN
INTRAVENOUS | Status: AC
  Filled 2017-07-19: qty 200

## 2017-07-19 MED ORDER — LIDOCAINE HCL (PF) 2 % IJ SOLN
INTRAMUSCULAR | Status: DC | PRN
  Administered 2017-07-19: 100 mg

## 2017-07-19 MED ORDER — PROPOFOL 500 MG/50ML IV EMUL
INTRAVENOUS | Status: DC | PRN
  Administered 2017-07-19: 75 ug/kg/min via INTRAVENOUS

## 2017-07-19 MED ORDER — MIDAZOLAM HCL 5 MG/5ML IJ SOLN
INTRAMUSCULAR | Status: DC | PRN
  Administered 2017-07-19: 2 mg via INTRAVENOUS

## 2017-07-19 MED ORDER — FENTANYL CITRATE (PF) 100 MCG/2ML IJ SOLN
INTRAMUSCULAR | Status: AC
  Filled 2017-07-19: qty 2

## 2017-07-19 MED ORDER — GLYCOPYRROLATE 0.2 MG/ML IJ SOLN
INTRAMUSCULAR | Status: AC
Start: 2017-07-19 — End: 2017-07-19
  Filled 2017-07-19: qty 1

## 2017-07-19 MED ORDER — PROPOFOL 10 MG/ML IV BOLUS
INTRAVENOUS | Status: AC
  Filled 2017-07-19: qty 20

## 2017-07-19 MED ORDER — SODIUM CHLORIDE 0.9 % IV SOLN
INTRAVENOUS | Status: DC
  Administered 2017-07-19: 11:00:00 via INTRAVENOUS

## 2017-07-19 MED ORDER — MIDAZOLAM HCL 2 MG/2ML IJ SOLN
INTRAMUSCULAR | Status: AC
  Filled 2017-07-19: qty 2

## 2017-07-19 MED ORDER — FENTANYL CITRATE (PF) 100 MCG/2ML IJ SOLN
INTRAMUSCULAR | Status: DC | PRN
  Administered 2017-07-19 (×2): 50 ug via INTRAVENOUS

## 2017-07-19 MED ORDER — LIDOCAINE HCL (PF) 2 % IJ SOLN
INTRAMUSCULAR | Status: AC
  Filled 2017-07-19: qty 10

## 2017-07-19 MED ORDER — VANCOMYCIN HCL IN DEXTROSE 1-5 GM/200ML-% IV SOLN
1000.0000 mg | Freq: Once | INTRAVENOUS | Status: AC
  Administered 2017-07-19: 1000 mg via INTRAVENOUS

## 2017-07-19 MED ORDER — DEXTROSE 5 % IV SOLN
100.0000 mg | Freq: Once | INTRAVENOUS | Status: DC
  Filled 2017-07-19: qty 2.5

## 2017-07-19 NOTE — Anesthesia Postprocedure Evaluation (Signed)
Anesthesia Post Note  Patient: Chelsea Burke  Procedure(s) Performed: COLONOSCOPY WITH PROPOFOL (N/A ) ESOPHAGOGASTRODUODENOSCOPY (EGD) WITH PROPOFOL (N/A )  Patient location during evaluation: Endoscopy Anesthesia Type: General Level of consciousness: awake and alert Pain management: pain level controlled Vital Signs Assessment: post-procedure vital signs reviewed and stable Respiratory status: spontaneous breathing, nonlabored ventilation, respiratory function stable and patient connected to nasal cannula oxygen Cardiovascular status: blood pressure returned to baseline and stable Postop Assessment: no apparent nausea or vomiting Anesthetic complications: no     Last Vitals:  Vitals:   07/19/17 1337 07/19/17 1347  BP: 140/77 (!) 162/88  Pulse: 80 74  Resp: (!) 27 19  Temp:    SpO2: 95% 99%    Last Pain:  Vitals:   07/19/17 1347  TempSrc:   PainSc: 0-No pain                 Heavyn Yearsley S

## 2017-07-19 NOTE — H&P (Signed)
Primary Care Physician:  Katheren Shams Primary Gastroenterologist:  Dr. Vira Agar  Pre-Procedure History & Physical: HPI:  Chelsea Burke is a 72 y.o. female is here for an endoscopy and colonoscopy. For PH colon polyps and Heartburn ( S/P Nissan fundoplication.   Past Medical History:  Diagnosis Date  . Arthritis   . Asthma   . Depression   . Dyspnea   . Fatty liver   . GERD (gastroesophageal reflux disease)   . History of hiatal hernia   . History of kidney stones   . Hyperlipidemia   . Hypertension     Past Surgical History:  Procedure Laterality Date  . ABDOMINAL HYSTERECTOMY    . BREAST BIOPSY Bilateral 03/2006   bil bx/clips-neg  . CHOLECYSTECTOMY    . NASAL SINUS SURGERY    . NISSEN FUNDOPLICATION    . TOTAL KNEE ARTHROPLASTY      Prior to Admission medications   Medication Sig Start Date End Date Taking? Authorizing Provider  acetaminophen (TYLENOL) 650 MG CR tablet Take 650 mg by mouth every 8 (eight) hours as needed for pain.   Yes [provider]  aspirin EC 81 MG tablet Take 81 mg by mouth daily.   Yes [provider]  atorvastatin (LIPITOR) 10 MG tablet Take 10 mg by mouth daily.   Yes [provider]  docusate sodium (COLACE) 100 MG capsule Take 100 mg by mouth 2 (two) times daily.   Yes [provider]  DULoxetine (CYMBALTA) 60 MG capsule Take 60 mg by mouth daily.   Yes [provider]  Fluticasone-Salmeterol (ADVAIR) 250-50 MCG/DOSE AEPB Inhale 1 puff into the lungs 2 (two) times daily.   Yes [provider]  hydrALAZINE (APRESOLINE) 25 MG tablet Take 25 mg by mouth 2 (two) times daily.    Yes [provider]  losartan (COZAAR) 100 MG tablet Take 100 mg by mouth daily.   Yes [provider]  Lutein 20 MG CAPS Take 20 mg by mouth.   Yes [provider]  meclizine (ANTIVERT) 25 MG tablet Take 1 tablet (25 mg total) by mouth 3 (three) times daily as needed for dizziness.  11/18/15  Yes Melynda Ripple, MD  omeprazole (PRILOSEC) 20 MG capsule Take 20 mg by mouth daily.   Yes [provider]  traZODone (DESYREL) 150 MG tablet Take by mouth at bedtime.   Yes [provider]  diazepam (VALIUM) 2 MG tablet Take 1 tablet (2 mg total) by mouth every 8 (eight) hours as needed (vertigo). 11/18/15   Melynda Ripple, MD    Allergies as of 07/09/2017 - Review Complete 11/18/2015  Allergen Reaction Noted  . Doxycycline Anaphylaxis 08/10/2014  . Keflex [cephalexin] Rash 08/10/2014  . Norvasc [amlodipine] Rash 11/18/2015  . Penicillins Rash 08/10/2014  . Sulfa antibiotics Rash 08/10/2014    History reviewed. No pertinent family history.  Social History   Socioeconomic History  . Marital status: Widowed    Spouse name: Not on file  . Number of children: Not on file  . Years of education: Not on file  . Highest education level: Not on file  Occupational History  . Not on file  Social Needs  . Financial resource strain: Not on file  . Food insecurity:    Worry: Not on file    Inability: Not on file  . Transportation needs:    Medical: Not on file    Non-medical: Not on file  Tobacco Use  . Smoking status:  Never Smoker  . Smokeless tobacco: Never Used  Substance and Sexual Activity  . Alcohol use: No  . Drug use: No  . Sexual activity: Not on file  Lifestyle  . Physical activity:    Days per week: Not on file    Minutes per session: Not on file  . Stress: Not on file  Relationships  . Social connections:    Talks on phone: Not on file    Gets together: Not on file    Attends religious service: Not on file    Active member of club or organization: Not on file    Attends meetings of clubs or organizations: Not on file    Relationship status: Not on file  . Intimate partner violence:    Fear of current or ex partner: Not on file    Emotionally abused: Not on file    Physically abused: Not on file    Forced sexual activity: Not on  file  Other Topics Concern  . Not on file  Social History Narrative  . Not on file    Review of Systems: See HPI, otherwise negative ROS  Physical Exam: BP 139/82   Pulse 85   Temp 98.8 F (37.1 C)   Resp 18   Ht 5\' 5"  (1.651 m)   Wt 117.9 kg (260 lb)   SpO2 98%   BMI 43.27 kg/m  General:   Alert,  pleasant and cooperative in NAD Head:  Normocephalic and atraumatic. Neck:  Supple; no masses or thyromegaly. Lungs:  Clear throughout to auscultation.    Heart:  Regular rate and rhythm. Abdomen:  Soft, nontender and nondistended. Normal bowel sounds, without guarding, and without rebound.   Neurologic:  Alert and  oriented x4;  grossly normal neurologically.  Impression/Plan: Chelsea Burke is here for an endoscopy and colonoscopy to be performed for [facility] colon polyps and heartburn ( after Nissan fundoplication.  Risks, benefits, limitations, and alternatives regarding  endoscopy and colonoscopy have been reviewed with the patient.  Questions have been answered.  All parties agreeable.   Gaylyn Cheers, MD  07/19/2017, 12:38 PM

## 2017-07-19 NOTE — Op Note (Signed)
[facility] Patient Name: Chelsea Burke Procedure Date: 07/19/2017 12:41 PM MRN: [id number] Account #: [id number] Date of Birth: 1945-10-18 Admit Type: Outpatient Age: 72 Room: [facility] ENDO ROOM 3 Gender: Female Note Status: Finalized Procedure:            Upper GI endoscopy Indications:          Dysphagia, Heartburn Providers:            Manya Silvas, MD Referring MD:         No Local Md, MD (Referring MD) Medicines:            Propofol per Anesthesia Complications:        No immediate complications. Procedure:            Pre-Anesthesia Assessment:                       - After reviewing the risks and benefits, the patient                        was deemed in satisfactory condition to undergo the                        procedure.                       After obtaining informed consent, the endoscope was                        passed under direct vision. Throughout the procedure,                        the patient's blood pressure, pulse, and oxygen                        saturations were monitored continuously. The Endoscope                        was introduced through the mouth, and advanced to the                        second part of duodenum. The upper GI endoscopy was                        accomplished without difficulty. The patient tolerated                        the procedure well. Findings:      The examined esophagus was normal. GEJ 42cm. The scope goes through the       gastroesophageal junction with gentle guidence. No inflammation noted.      Patchy minimal inflammation characterized by erythema and granularity       was found in the gastric body. Biopsies were taken with a cold forceps       for histology. Biopsies were taken with a cold forceps for Helicobacter       pylori testing.      The examined duodenum was normal. Impression:           - Normal esophagus.                       - Gastritis. Biopsied.   -  Normal examined duodenum. Recommendation:       - Await pathology results. Manya Silvas, MD 07/19/2017 12:56:12 PM This report has been signed electronically. Number of Addenda: 0 Note Initiated On: 07/19/2017 12:41 PM      [facility]

## 2017-07-19 NOTE — Anesthesia Preprocedure Evaluation (Signed)
Anesthesia Evaluation  Patient identified by MRN, date of birth, ID band Patient awake    Reviewed: Allergy & Precautions, H&P , NPO status , Patient's Chart, lab work & pertinent test results, reviewed documented beta blocker date and time   Airway Mallampati: III   Neck ROM: full    Dental  (+) Poor Dentition, Teeth Intact   Pulmonary neg pulmonary ROS, shortness of breath and with exertion, asthma ,    Pulmonary exam normal        Cardiovascular Exercise Tolerance: Poor hypertension, On Medications negative cardio ROS Normal cardiovascular exam Rhythm:regular Rate:Normal     Neuro/Psych PSYCHIATRIC DISORDERS Depression negative neurological ROS  negative psych ROS   GI/Hepatic negative GI ROS, Neg liver ROS, hiatal hernia, GERD  Medicated,  Endo/Other  negative endocrine ROSMorbid obesity  Renal/GU negative Renal ROS  negative genitourinary   Musculoskeletal   Abdominal   Peds  Hematology negative hematology ROS (+)   Anesthesia Other Findings Past Medical History: No date: Arthritis No date: Asthma No date: Depression No date: Dyspnea No date: Fatty liver No date: GERD (gastroesophageal reflux disease) No date: History of hiatal hernia No date: History of kidney stones No date: Hyperlipidemia No date: Hypertension Past Surgical History: No date: ABDOMINAL HYSTERECTOMY 03/2006: BREAST BIOPSY; Bilateral     Comment:  bil bx/clips-neg No date: CHOLECYSTECTOMY No date: NASAL SINUS SURGERY No date: NISSEN FUNDOPLICATION No date: TOTAL KNEE ARTHROPLASTY   Reproductive/Obstetrics negative OB ROS                             Anesthesia Physical Anesthesia Plan  ASA: III  Anesthesia Plan: General   Post-op Pain Management:    Induction:   PONV Risk Score and Plan:   Airway Management Planned:   Additional Equipment:   Intra-op Plan:   Post-operative Plan:    Informed Consent: I have reviewed the patients History and Physical, chart, labs and discussed the procedure including the risks, benefits and alternatives for the proposed anesthesia with the patient or authorized representative who has indicated his/her understanding and acceptance.   Dental Advisory Given  Plan Discussed with: CRNA  Anesthesia Plan Comments:         Anesthesia Quick Evaluation

## 2017-07-19 NOTE — Anesthesia Post-op Follow-up Note (Signed)
Anesthesia QCDR form completed.        

## 2017-07-19 NOTE — Transfer of Care (Signed)
Immediate Anesthesia Transfer of Care Note  Patient: Chelsea Burke  Procedure(s) Performed: COLONOSCOPY WITH PROPOFOL (N/A ) ESOPHAGOGASTRODUODENOSCOPY (EGD) WITH PROPOFOL (N/A )  Patient Location: PACU  Anesthesia Type:General  Level of Consciousness: sedated  Airway & Oxygen Therapy: Patient Spontanous Breathing and Patient connected to nasal cannula oxygen  Post-op Assessment: Report given to RN and Post -op Vital signs reviewed and stable  Post vital signs: Reviewed and stable  Last Vitals:  Vitals Value Taken Time  BP 121/73 07/19/2017  1:27 PM  Temp 36.6 C 07/19/2017  1:27 PM  Pulse 80 07/19/2017  1:27 PM  Resp 19 07/19/2017  1:27 PM  SpO2 98 % 07/19/2017  1:27 PM  Vitals shown include unvalidated device data.  Last Pain:  Vitals:   07/19/17 1327  TempSrc: Tympanic  PainSc: 0-No pain         Complications: No apparent anesthesia complications

## 2017-07-19 NOTE — Op Note (Signed)
[facility] Patient Name: Chelsea Burke Procedure Date: 07/19/2017 12:40 PM MRN: [id number] Account #: [id number] Date of Birth: February 21, 1946 Admit Type: Outpatient Age: 72 Room: [facility] ENDO ROOM 3 Gender: Female Note Status: Finalized Procedure:            Colonoscopy Indications:          High risk colon cancer surveillance: Personal history                        of colonic polyps Providers:            Manya Silvas, MD Referring MD:         No Local Md, MD (Referring MD) Medicines:            Propofol per Anesthesia Complications:        No immediate complications. Procedure:            Pre-Anesthesia Assessment:                       - After reviewing the risks and benefits, the patient                        was deemed in satisfactory condition to undergo the                        procedure.                       After obtaining informed consent, the colonoscope was                        passed under direct vision. Throughout the procedure,                        the patient's blood pressure, pulse, and oxygen                        saturations were monitored continuously. The                        Colonoscope was introduced through the anus and                        advanced to the the cecum, identified by the ileocecal                        valve. The colonoscopy was technically difficult and                        complex due to the patient's body habitus. The patient                        tolerated the procedure well. The quality of the bowel                        preparation was excellent. Findings:      A small polyp was found in the proximal descending colon. The polyp was       sessile. The polyp was removed with a hot snare. Resection and retrieval       were complete.  Internal hemorrhoids were found during endoscopy. The hemorrhoids were       small and Grade I (internal hemorrhoids that do not prolapse). The colon    was long and difficult.Prep was very good. A few diverticuli seen. No       other polyps were seen. The abdomen was obese and difficult to pass       through the colon. Impression:           - One small polyp in the proximal descending colon,                        removed with a hot snare. Resected and retrieved.                       - Internal hemorrhoids. Recommendation:       - Await pathology results. Manya Silvas, MD 07/19/2017 1:29:15 PM This report has been signed electronically. Number of Addenda: 0 Note Initiated On: 07/19/2017 12:40 PM Scope Withdrawal Time: 0 hours 8 minutes 3 seconds  Total Procedure Duration: 0 hours 24 minutes 5 seconds       [facility]

## 2017-07-22 ENCOUNTER — Encounter: Payer: Self-pay | Admitting: Unknown Physician Specialty

## 2017-07-22 LAB — SURGICAL PATHOLOGY

## 2017-08-29 DIAGNOSIS — I872 Venous insufficiency (chronic) (peripheral): Secondary | ICD-10-CM | POA: Insufficient documentation

## 2018-06-09 ENCOUNTER — Encounter: Payer: Self-pay | Admitting: Urology

## 2018-06-13 ENCOUNTER — Ambulatory Visit: Payer: Self-pay | Admitting: Urology

## 2018-07-18 ENCOUNTER — Ambulatory Visit: Payer: Self-pay | Admitting: Urology

## 2018-10-07 ENCOUNTER — Other Ambulatory Visit: Payer: Self-pay

## 2018-10-07 DIAGNOSIS — N200 Calculus of kidney: Secondary | ICD-10-CM

## 2018-10-10 ENCOUNTER — Ambulatory Visit
Admission: RE | Admit: 2018-10-10 | Discharge: 2018-10-10 | Disposition: A | Discharge status: Home / Self Care | Payer: Medicare Other | Attending: Urology | Admitting: Urology

## 2018-10-10 ENCOUNTER — Ambulatory Visit (INDEPENDENT_AMBULATORY_CARE_PROVIDER_SITE_OTHER): Payer: Medicare Other | Admitting: Urology

## 2018-10-10 ENCOUNTER — Encounter: Payer: Self-pay | Admitting: Urology

## 2018-10-10 ENCOUNTER — Other Ambulatory Visit
Admission: RE | Admit: 2018-10-10 | Discharge: 2018-10-10 | Disposition: A | Discharge status: Home / Self Care | Payer: Medicare Other | Source: Home / Self Care | Attending: Urology | Admitting: Urology

## 2018-10-10 ENCOUNTER — Ambulatory Visit
Admission: RE | Admit: 2018-10-10 | Discharge: 2018-10-10 | Disposition: A | Discharge status: Home / Self Care | Payer: Medicare Other | Source: Ambulatory Visit | Attending: Urology | Admitting: Urology

## 2018-10-10 ENCOUNTER — Other Ambulatory Visit: Payer: Self-pay

## 2018-10-10 VITALS — BP 140/68 | HR 62 | Ht 65.0 in | Wt 254.0 lb

## 2018-10-10 DIAGNOSIS — M1710 Unilateral primary osteoarthritis, unspecified knee: Secondary | ICD-10-CM | POA: Insufficient documentation

## 2018-10-10 DIAGNOSIS — N3941 Urge incontinence: Secondary | ICD-10-CM

## 2018-10-10 DIAGNOSIS — E782 Mixed hyperlipidemia: Secondary | ICD-10-CM | POA: Insufficient documentation

## 2018-10-10 DIAGNOSIS — F32A Depression, unspecified: Secondary | ICD-10-CM | POA: Insufficient documentation

## 2018-10-10 DIAGNOSIS — N393 Stress incontinence (female) (male): Secondary | ICD-10-CM

## 2018-10-10 DIAGNOSIS — N200 Calculus of kidney: Secondary | ICD-10-CM

## 2018-10-10 DIAGNOSIS — F329 Major depressive disorder, single episode, unspecified: Secondary | ICD-10-CM | POA: Insufficient documentation

## 2018-10-10 DIAGNOSIS — R3915 Urgency of urination: Secondary | ICD-10-CM

## 2018-10-10 DIAGNOSIS — Z90710 Acquired absence of both cervix and uterus: Secondary | ICD-10-CM | POA: Insufficient documentation

## 2018-10-10 DIAGNOSIS — K219 Gastro-esophageal reflux disease without esophagitis: Secondary | ICD-10-CM | POA: Insufficient documentation

## 2018-10-10 DIAGNOSIS — E66813 Obesity, class 3: Secondary | ICD-10-CM | POA: Insufficient documentation

## 2018-10-10 DIAGNOSIS — Z9889 Other specified postprocedural states: Secondary | ICD-10-CM | POA: Insufficient documentation

## 2018-10-10 DIAGNOSIS — K8020 Calculus of gallbladder without cholecystitis without obstruction: Secondary | ICD-10-CM | POA: Insufficient documentation

## 2018-10-10 HISTORY — PX: NISSEN FUNDOPLICATION: SHX2091

## 2018-10-10 LAB — URINALYSIS, COMPLETE (UACMP) WITH MICROSCOPIC
Bacteria, UA: NONE SEEN
Bilirubin Urine: NEGATIVE
Glucose, UA: NEGATIVE mg/dL
Hgb urine dipstick: NEGATIVE
Ketones, ur: NEGATIVE mg/dL
Leukocytes,Ua: NEGATIVE
Nitrite: NEGATIVE
Protein, ur: NEGATIVE mg/dL
Specific Gravity, Urine: 1.01 (ref 1.005–1.030)
WBC, UA: NONE SEEN WBC/hpf (ref 0–5)
pH: 5.5 (ref 5.0–8.0)

## 2018-10-10 MED ORDER — MIRABEGRON ER 50 MG PO TB24: 50.0000 mg | ORAL_TABLET | Freq: Every day | ORAL | 0 refills | Status: DC

## 2018-10-10 MED ORDER — MIRABEGRON ER 50 MG PO TB24: 50.0000 mg | ORAL_TABLET | Freq: Every day | ORAL | 5 refills | Status: DC

## 2018-10-10 NOTE — Progress Notes (Signed)
10/10/2018 10:08 AM   Chelsea Burke 1945/06/22 [id number]  Referring provider: Medicine, [facility] [address] [place],  [place] [address]  Chief Complaint  Patient presents with  . Nephrolithiasis    HPI: 73 year old female who presents today to establish care for urinary symptoms as well as history of kidney stones.  She reports that she was followed by urologist at [facility] many years ago for history of kidney stones.  She is never required surgical intervention for stones.  She mentions today that Q onset right flank pain after bending over in the shower.  This lasted throughout the day and has almost completely subsided this point time.  She had no associated dysuria, gross hematuria or any other deviations in her urinary symptoms her baseline.  Most recent CT scan in 2015 does show multiple bilateral stones measuring up to 12 mm.  In addition to the above, I today is urinary urgency and frequency.  She also has occasional urge incontinence that happen several times a week.  She wears a safety pad for this.  This been going on for years but his progress to the point where it is become quite bothersome to her.  She also leaks a small amount when she laughs coughs and sneezes but this is not as bothersome.  She never tried any medications for this.  No exacerbating alleviating symptoms.  She denies any nocturia associated with this.  She is nondiabetic.  She was recently started on spironolactone 25 mg this seems to make no impact on her urinary symptoms.  She does drink 1 to 2 cups of coffee in the morning but has her symptoms throughout the day and late evening.  She is to drink a lot of tea but is stopped doing this.  She primarily drinks water and juice throughout the day.  She is not sexually active.  No vaginal bulging or pressure.  She is status post hysterectomy.  She does have a history of chronic constipation.  She takes 4 stool softeners per day.  She knows if  she eats certain foods like cheeses then she will not have a bowel movement for 2 to 3 days.   PMH: Past Medical History:  Diagnosis Date  . Arthritis   . Asthma   . Depression   . Dyspnea   . Fatty liver   . GERD (gastroesophageal reflux disease)   . History of hiatal hernia   . History of kidney stones   . Hyperlipidemia   . Hypertension     Surgical History: Past Surgical History:  Procedure Laterality Date  . ABDOMINAL HYSTERECTOMY    . BREAST BIOPSY Bilateral 03/2006   bil bx/clips-neg  . CHOLECYSTECTOMY    . COLONOSCOPY WITH PROPOFOL N/A 07/19/2017   Procedure: COLONOSCOPY WITH PROPOFOL;  Surgeon: Manya Silvas, MD;  Location: [facility] ENDOSCOPY;  Service: Endoscopy;  Laterality: N/A;  . ESOPHAGOGASTRODUODENOSCOPY (EGD) WITH PROPOFOL N/A 07/19/2017   Procedure: ESOPHAGOGASTRODUODENOSCOPY (EGD) WITH PROPOFOL;  Surgeon: Manya Silvas, MD;  Location: [facility] ENDOSCOPY;  Service: Endoscopy;  Laterality: N/A;  . NASAL SINUS SURGERY    . NISSEN FUNDOPLICATION    . TOTAL KNEE ARTHROPLASTY      Home Medications:  Allergies as of 10/10/2018      Reactions   Doxycycline Anaphylaxis   Keflex [cephalexin] Rash   Norvasc [amlodipine] Rash   Penicillins Rash   Sulfa Antibiotics Rash      Medication List       Accurate as  of October 10, 2018 10:08 AM. If you have any questions, ask your nurse or doctor.        STOP taking these medications   hydrALAZINE 25 MG tablet Commonly known as: APRESOLINE Stopped by: Hollice Espy, MD   losartan 100 MG tablet Commonly known as: COZAAR Stopped by: Hollice Espy, MD   omeprazole 20 MG capsule Commonly known as: PRILOSEC Stopped by: Hollice Espy, MD     TAKE these medications   acetaminophen 650 MG CR tablet Commonly known as: TYLENOL Take 650 mg by mouth every 8 (eight) hours as needed for pain.   aspirin EC 81 MG tablet Take 81 mg by mouth daily.   atorvastatin 10 MG tablet Commonly known as: LIPITOR Take 10 mg by  mouth daily.   diazepam 2 MG tablet Commonly known as: Valium Take 1 tablet (2 mg total) by mouth every 8 (eight) hours as needed (vertigo).   docusate sodium 100 MG capsule Commonly known as: COLACE Take 100 mg by mouth 2 (two) times daily.   DULoxetine 60 MG capsule Commonly known as: CYMBALTA Take 60 mg by mouth daily.   Fluticasone-Salmeterol 250-50 MCG/DOSE Aepb Commonly known as: ADVAIR Inhale 1 puff into the lungs 2 (two) times daily.   Lutein 20 MG Caps Take 20 mg by mouth.   meclizine 25 MG tablet Commonly known as: ANTIVERT Take 1 tablet (25 mg total) by mouth 3 (three) times daily as needed for dizziness.   mirabegron ER 50 MG Tb24 tablet Commonly known as: MYRBETRIQ Take 1 tablet (50 mg total) by mouth daily. Started by: Hollice Espy, MD   mirabegron ER 50 MG Tb24 tablet Commonly known as: MYRBETRIQ Take 1 tablet (50 mg total) by mouth daily. Started by: Hollice Espy, MD   traZODone 150 MG tablet Commonly known as: DESYREL Take by mouth at bedtime.       Allergies:  Allergies  Allergen Reactions  . Doxycycline Anaphylaxis  . Keflex [Cephalexin] Rash  . Norvasc [Amlodipine] Rash  . Penicillins Rash  . Sulfa Antibiotics Rash    Family History: No family history on file.  Social History:  reports that she has never smoked. She has never used smokeless tobacco. She reports that she does not drink alcohol or use drugs.  ROS: UROLOGY Frequent Urination?: No Hard to postpone urination?: No Burning/pain with urination?: No Get up at night to urinate?: No Leakage of urine?: No Urine stream starts and stops?: No Trouble starting stream?: No Do you have to strain to urinate?: No Blood in urine?: No Urinary tract infection?: No Sexually transmitted disease?: No Injury to kidneys or bladder?: No Painful intercourse?: No Weak stream?: No Currently pregnant?: No Vaginal bleeding?: No Last menstrual period?: n  Gastrointestinal Nausea?: No  Vomiting?: No Indigestion/heartburn?: No Diarrhea?: No Constipation?: No  Constitutional Fever: No Night sweats?: No Weight loss?: No Fatigue?: Yes  Skin Skin rash/lesions?: No Itching?: No  Eyes Blurred vision?: No Double vision?: No  Ears/Nose/Throat Sore throat?: No Sinus problems?: Yes  Hematologic/Lymphatic Swollen glands?: No Easy bruising?: Yes  Cardiovascular Leg swelling?: No Chest pain?: No  Respiratory Cough?: No Shortness of breath?: No  Endocrine Excessive thirst?: No  Musculoskeletal Back pain?: No Joint pain?: Yes  Neurological Headaches?: No Dizziness?: No  Psychologic Depression?: Yes Anxiety?: No  Physical Exam: BP 140/68   Pulse 62   Ht 5\' 5"  (1.651 m)   Wt 254 lb (115.2 kg)   BMI 42.27 kg/m   Constitutional:  Alert and oriented,  No acute distress. HEENT: [place] AT, moist mucus membranes.  Trachea midline, no masses. Cardiovascular: No clubbing, cyanosis, or edema. Respiratory: Normal respiratory effort, no increased work of breathing. GI: Abdomen is soft, nontender, nondistended, obese Skin: No rashes, bruises or suspicious lesions. Neurologic: Grossly intact, no focal deficits, moving all 4 extremities. Psychiatric: Normal mood and affect.  Laboratory Data: Lab Results  Component Value Date   WBC 6.1 08/10/2014   HGB 13.8 08/10/2014   HCT 41.4 08/10/2014   MCV 89.0 08/10/2014   PLT 217 08/10/2014    Lab Results  Component Value Date   CREATININE 0.67 08/10/2014    Urinalysis    Component Value Date/Time   COLORURINE YELLOW 10/10/2018 0905   APPEARANCEUR CLEAR 10/10/2018 0905   LABSPEC 1.010 10/10/2018 0905   PHURINE 5.5 10/10/2018 0905   GLUCOSEU NEGATIVE 10/10/2018 0905   HGBUR NEGATIVE 10/10/2018 0905   BILIRUBINUR NEGATIVE 10/10/2018 0905   KETONESUR NEGATIVE 10/10/2018 0905   PROTEINUR NEGATIVE 10/10/2018 0905   NITRITE NEGATIVE 10/10/2018 0905   LEUKOCYTESUR NEGATIVE 10/10/2018 0905    Lab Results   Component Value Date   BACTERIA NONE SEEN 10/10/2018    Pertinent Imaging: KUB ordered, pending  Assessment & Plan:    1. Nephrolithiasis Personal history of nephrolithiasis Recent episode of right flank pain exacerbated physical activity, unclear whether or not this was related to stone episode KUB today for baseline May consider treatment if she in fact has stones that are large Plan for follow-up in 3 months She was advised if she has a recurrent episode of flank pain, she should call our office and we will order a noncontrast CT scan - DG Abd 1 View; Future  2. Urinary urgency Severe baseline urinary urgency frequency and urge incontinence consistent with longstanding history of OAB Extensive discussion modifications today In addition to the above, she is interested in pharmacotherapy.  Given her history of chronic constipation, fever trial of beta 3 agonist She was given samples of Myrbetriq 50 mg today as well as prescription sent to pharmacy, possible side effects including increased blood pressure were reviewed and will need to monitor closely  3. Urge incontinence As above  4. Stress incontinence, female Less bothersome to her Recommend considering weight loss and pelvic floor exercises   Return in about 3 months (around 01/10/2019).  Hollice Espy, MD  [facility] [address], [place], [place] [address] [phone]

## 2018-10-13 ENCOUNTER — Telehealth: Payer: Self-pay | Admitting: *Deleted

## 2018-10-13 NOTE — Telephone Encounter (Addendum)
Informed patient-verbalized understanding.  ----- Message from Hollice Espy, MD sent at 10/12/2018  2:15 PM EDT ----- It does look like she has bilateral kidney stones, larger on the left side.  We can discuss this at her follow-up.  Also, let her know that it looks like she is constipated.  No ureteral stones are appreciated.  Hollice Espy, MD

## 2018-10-16 ENCOUNTER — Encounter: Payer: Self-pay | Admitting: Urology

## 2018-11-24 ENCOUNTER — Encounter: Payer: Self-pay | Admitting: Urology

## 2018-12-31 ENCOUNTER — Encounter: Payer: Self-pay | Admitting: Urology

## 2018-12-31 NOTE — Telephone Encounter (Signed)
I called the patient back to let her know that the bill she has is for the radiology that she had done for that DOS, not for our office. She has a zero balance with [place]. She now understands and will try and get the $40.00 co pay removed from the hospital side. She said she did not pay attention to the bill where it said it for the scan she had done.   Thanks, Sharyn Lull

## 2019-01-12 ENCOUNTER — Other Ambulatory Visit: Payer: Self-pay

## 2019-01-12 DIAGNOSIS — Z20822 Contact with and (suspected) exposure to covid-19: Secondary | ICD-10-CM

## 2019-01-14 LAB — NOVEL CORONAVIRUS, NAA: SARS-CoV-2, NAA: NOT DETECTED

## 2019-01-16 ENCOUNTER — Ambulatory Visit: Payer: Medicare Other | Admitting: Urology

## 2019-02-06 ENCOUNTER — Ambulatory Visit: Payer: Medicare Other | Admitting: Urology

## 2019-03-17 ENCOUNTER — Other Ambulatory Visit: Payer: Self-pay | Admitting: Family Medicine

## 2019-03-17 DIAGNOSIS — Z1231 Encounter for screening mammogram for malignant neoplasm of breast: Secondary | ICD-10-CM

## 2019-04-03 ENCOUNTER — Ambulatory Visit: Payer: Medicare Other | Admitting: Urology

## 2019-10-02 ENCOUNTER — Other Ambulatory Visit: Payer: Self-pay | Admitting: Physician Assistant

## 2019-10-02 DIAGNOSIS — H90A21 Sensorineural hearing loss, unilateral, right ear, with restricted hearing on the contralateral side: Secondary | ICD-10-CM

## 2019-10-19 ENCOUNTER — Ambulatory Visit
Admission: RE | Admit: 2019-10-19 | Discharge: 2019-10-19 | Disposition: A | Discharge status: Home / Self Care | Payer: Medicare PPO | Source: Ambulatory Visit | Attending: Physician Assistant | Admitting: Physician Assistant

## 2019-10-19 ENCOUNTER — Other Ambulatory Visit: Payer: Self-pay

## 2019-10-19 DIAGNOSIS — H90A21 Sensorineural hearing loss, unilateral, right ear, with restricted hearing on the contralateral side: Secondary | ICD-10-CM | POA: Diagnosis present

## 2019-10-19 MED ORDER — GADOBUTROL 1 MMOL/ML IV SOLN
10.0000 mL | Freq: Once | INTRAVENOUS | Status: AC | PRN
  Administered 2019-10-19: 10 mL via INTRAVENOUS

## 2020-08-26 ENCOUNTER — Ambulatory Visit
Admission: EM | Admit: 2020-08-26 | Discharge: 2020-08-26 | Disposition: A | Discharge status: Home / Self Care | Payer: Medicare PPO | Attending: Emergency Medicine | Admitting: Emergency Medicine

## 2020-08-26 ENCOUNTER — Other Ambulatory Visit: Payer: Self-pay

## 2020-08-26 ENCOUNTER — Encounter: Payer: Self-pay | Admitting: Emergency Medicine

## 2020-08-26 DIAGNOSIS — K115 Sialolithiasis: Secondary | ICD-10-CM

## 2020-08-26 NOTE — Discharge Instructions (Addendum)
increase fluid intake, apply moist heat, massage the gland and "milk" the duct. You can also try sialagogues such as lemon drops or sour patch kids.  You can try 400 mg of ibuprofen and 500 mg 2000 mg of Tylenol as needed for pain.  If you start to have increasing pain, fever, or purulent drainage from duct, go to the ED for imaging to confirm the diagnosis and to be started on antibiotics. Follow-up with [facility] if not better in 5 to 7 days.

## 2020-08-26 NOTE — ED Triage Notes (Signed)
Pt c/o right sided neck pain and swell. Started last night. She states when she chews it makes the pain worse.

## 2020-08-26 NOTE — ED Provider Notes (Signed)
HPI  SUBJECTIVE:  Chelsea Burke is a 75 y.o. female who presents with a tender, swollen mass underneath her right jaw starting yesterday.  She states that it gets bigger and hurts more when she salivates/eats.  No body aches, fevers, sore throat, trismus, sensation of throat swelling shut, difficulty breathing, voice changes, dental or gum pain.  She states that she had similar symptoms a month ago but did not seek medical attention.  No recent change in medications.  She has not tried anything for this.  No alleviating factors.  Symptoms are worse with salivating and eating.  She has a past medical history of hypertension.  No history of diabetes, chronic kidney disease.  PZW:CHENIDPOEU, Chrissie Noa, MD     Past Medical History:  Diagnosis Date   Arthritis    Asthma    Depression    Dyspnea    Fatty liver    GERD (gastroesophageal reflux disease)    History of hiatal hernia    History of kidney stones    Hyperlipidemia    Hypertension     Past Surgical History:  Procedure Laterality Date   ABDOMINAL HYSTERECTOMY     BREAST BIOPSY Bilateral 03/2006   bil bx/clips-neg   CHOLECYSTECTOMY     COLONOSCOPY WITH PROPOFOL N/A 07/19/2017   Procedure: COLONOSCOPY WITH PROPOFOL;  Surgeon: Manya Silvas, MD;  Location: [facility] ENDOSCOPY;  Service: Endoscopy;  Laterality: N/A;   ESOPHAGOGASTRODUODENOSCOPY (EGD) WITH PROPOFOL N/A 07/19/2017   Procedure: ESOPHAGOGASTRODUODENOSCOPY (EGD) WITH PROPOFOL;  Surgeon: Manya Silvas, MD;  Location: [facility] ENDOSCOPY;  Service: Endoscopy;  Laterality: N/A;   NASAL SINUS SURGERY     NISSEN FUNDOPLICATION     TOTAL KNEE ARTHROPLASTY      History reviewed. No pertinent family history.  Social History   Tobacco Use   Smoking status: Never   Smokeless tobacco: Never  Substance Use Topics   Alcohol use: No   Drug use: No    No current facility-administered medications for this encounter.  Current Outpatient Medications:    acetaminophen (TYLENOL) 650  MG CR tablet, Take 650 mg by mouth every 8 (eight) hours as needed for pain., Disp: , Rfl:    atorvastatin (LIPITOR) 10 MG tablet, Take 10 mg by mouth daily., Disp: , Rfl:    DULoxetine (CYMBALTA) 60 MG capsule, Take 60 mg by mouth daily., Disp: , Rfl:    Lutein 20 MG CAPS, Take 20 mg by mouth., Disp: , Rfl:    spironolactone (ALDACTONE) 25 MG tablet, Take 25 mg by mouth daily., Disp: , Rfl:    telmisartan (MICARDIS) 80 MG tablet, Take by mouth., Disp: , Rfl:    traZODone (DESYREL) 150 MG tablet, Take by mouth at bedtime., Disp: , Rfl:    aspirin EC 81 MG tablet, Take 81 mg by mouth daily., Disp: , Rfl:    docusate sodium (COLACE) 100 MG capsule, Take 100 mg by mouth 2 (two) times daily., Disp: , Rfl:    DULoxetine (CYMBALTA) 60 MG capsule, Take by mouth., Disp: , Rfl:    mirabegron ER (MYRBETRIQ) 50 MG TB24 tablet, Take 1 tablet (50 mg total) by mouth daily., Disp: 30 tablet, Rfl: 0   mirabegron ER (MYRBETRIQ) 50 MG TB24 tablet, Take 1 tablet (50 mg total) by mouth daily., Disp: 30 tablet, Rfl: 5  Allergies  Allergen Reactions   Doxycycline Anaphylaxis   Keflex [Cephalexin] Rash   Norvasc [Amlodipine] Rash   Penicillins Rash   Sulfa Antibiotics Rash  ROS  As noted in HPI.   Physical Exam  BP (!) 159/61 (BP Location: Left Arm)   Pulse 70   Temp 97.9 F (36.6 C) (Oral)   Resp 18   Ht 5\' 5"  (1.651 m)   Wt 115.2 kg   SpO2 100%   BMI 42.26 kg/m   Constitutional: Well developed, well nourished, no acute distress Eyes:  EOMI, conjunctiva normal bilaterally HENT: Normocephalic, atraumatic,mucus membranes moist.  Tender swelling at the submandibular gland.  No Overlying induration, erythema.  No tenderness, swelling of the parotid or sublingual gland.  Mild right neck fullness.  No expressible purulent drainage from Stensen's or Wharton's duct.  Patient with lower dentures.  Gingiva normal.  No swelling underneath the tongue.  No trismus. Neck: No cervical  lymphadenopathy Respiratory: Normal inspiratory effort Cardiovascular: Normal rate GI: nondistended skin: No rash, skin intact Musculoskeletal: no deformities Neurologic: Alert & oriented x 3, no focal neuro deficits Psychiatric: Speech and behavior appropriate   ED Course   Medications - No data to display  No orders of the defined types were placed in this encounter.   No results found for this or any previous visit (from the past 24 hour(s)). No results found.  ED Clinical Impression  1. Salivary gland stone      ED Assessment/Plan  Presentation most consistent with a right submandibular gland stone.  She does not have any systemic involvement at this time.  Deferring antibiotics today.  Will have patient increase fluid intake, apply moist heat, massage the gland and "milk" the duct, she can also try sialagogues such as lemon drops or sour patch kids.  If she starts to have increasing pain, fever, or purulent drainage from duct, advised her to go to the ED for imaging to confirm the diagnosis and to be started on antibiotics.   will have her follow-up with  ENT where she is a patient if she does not improve within a week.   Discussed  MDM, treatment plan, and plan for follow-up with patient. Discussed sn/sx that should prompt return to the ED. patient agrees with plan.   No orders of the defined types were placed in this encounter.     *This clinic note was created using Dragon dictation software. Therefore, there may be occasional mistakes despite careful proofreading.  ?   Melynda Ripple, MD 08/27/20 [phone]

## 2021-07-26 ENCOUNTER — Other Ambulatory Visit: Payer: Self-pay | Admitting: Orthopedic Surgery

## 2021-07-26 DIAGNOSIS — Z96651 Presence of right artificial knee joint: Secondary | ICD-10-CM

## 2021-07-26 DIAGNOSIS — S8011XA Contusion of right lower leg, initial encounter: Secondary | ICD-10-CM

## 2021-07-28 ENCOUNTER — Ambulatory Visit
Admission: RE | Admit: 2021-07-28 | Discharge: 2021-07-28 | Disposition: A | Discharge status: Home / Self Care | Payer: Medicare PPO | Source: Ambulatory Visit | Attending: Orthopedic Surgery | Admitting: Orthopedic Surgery

## 2021-07-28 DIAGNOSIS — Z96651 Presence of right artificial knee joint: Secondary | ICD-10-CM | POA: Insufficient documentation

## 2021-07-28 DIAGNOSIS — S8001XA Contusion of right knee, initial encounter: Secondary | ICD-10-CM | POA: Diagnosis present

## 2021-07-28 DIAGNOSIS — S8011XA Contusion of right lower leg, initial encounter: Secondary | ICD-10-CM | POA: Diagnosis present

## 2021-11-27 DIAGNOSIS — R399 Unspecified symptoms and signs involving the genitourinary system: Secondary | ICD-10-CM | POA: Diagnosis not present

## 2021-11-27 DIAGNOSIS — N390 Urinary tract infection, site not specified: Secondary | ICD-10-CM | POA: Diagnosis not present

## 2021-12-14 DIAGNOSIS — R350 Frequency of micturition: Secondary | ICD-10-CM | POA: Diagnosis not present

## 2021-12-14 DIAGNOSIS — R399 Unspecified symptoms and signs involving the genitourinary system: Secondary | ICD-10-CM | POA: Diagnosis not present

## 2022-01-09 DIAGNOSIS — J4530 Mild persistent asthma, uncomplicated: Secondary | ICD-10-CM | POA: Diagnosis not present

## 2022-01-09 DIAGNOSIS — Z1331 Encounter for screening for depression: Secondary | ICD-10-CM | POA: Diagnosis not present

## 2022-01-09 DIAGNOSIS — F3341 Major depressive disorder, recurrent, in partial remission: Secondary | ICD-10-CM | POA: Diagnosis not present

## 2022-01-09 DIAGNOSIS — K219 Gastro-esophageal reflux disease without esophagitis: Secondary | ICD-10-CM | POA: Diagnosis not present

## 2022-01-09 DIAGNOSIS — Z0000 Encounter for general adult medical examination without abnormal findings: Secondary | ICD-10-CM | POA: Diagnosis not present

## 2022-01-09 DIAGNOSIS — E782 Mixed hyperlipidemia: Secondary | ICD-10-CM | POA: Diagnosis not present

## 2022-01-09 DIAGNOSIS — I10 Essential (primary) hypertension: Secondary | ICD-10-CM | POA: Diagnosis not present

## 2022-01-09 DIAGNOSIS — R7302 Impaired glucose tolerance (oral): Secondary | ICD-10-CM | POA: Diagnosis not present

## 2022-01-09 DIAGNOSIS — R3915 Urgency of urination: Secondary | ICD-10-CM | POA: Diagnosis not present

## 2022-01-15 DIAGNOSIS — E782 Mixed hyperlipidemia: Secondary | ICD-10-CM | POA: Diagnosis not present

## 2022-01-15 DIAGNOSIS — R7302 Impaired glucose tolerance (oral): Secondary | ICD-10-CM | POA: Diagnosis not present

## 2022-01-17 ENCOUNTER — Other Ambulatory Visit: Payer: Self-pay

## 2022-01-17 DIAGNOSIS — R350 Frequency of micturition: Secondary | ICD-10-CM

## 2022-01-17 DIAGNOSIS — E782 Mixed hyperlipidemia: Secondary | ICD-10-CM | POA: Diagnosis not present

## 2022-01-17 DIAGNOSIS — I10 Essential (primary) hypertension: Secondary | ICD-10-CM | POA: Diagnosis not present

## 2022-01-19 ENCOUNTER — Ambulatory Visit
Admission: RE | Admit: 2022-01-19 | Discharge: 2022-01-19 | Disposition: A | Discharge status: Home / Self Care | Payer: Medicare PPO | Source: Ambulatory Visit | Attending: Urology | Admitting: Urology

## 2022-01-19 ENCOUNTER — Encounter: Payer: Self-pay | Admitting: Urology

## 2022-01-19 ENCOUNTER — Other Ambulatory Visit
Admission: RE | Admit: 2022-01-19 | Discharge: 2022-01-19 | Disposition: A | Discharge status: Home / Self Care | Payer: Medicare PPO | Source: Home / Self Care | Attending: Urology | Admitting: Urology

## 2022-01-19 ENCOUNTER — Ambulatory Visit
Admission: RE | Admit: 2022-01-19 | Discharge: 2022-01-19 | Disposition: A | Discharge status: Home / Self Care | Payer: Medicare PPO | Attending: Urology | Admitting: Urology

## 2022-01-19 ENCOUNTER — Ambulatory Visit: Payer: Medicare PPO | Admitting: Urology

## 2022-01-19 VITALS — BP 152/66 | HR 82 | Ht 65.0 in | Wt 269.0 lb

## 2022-01-19 DIAGNOSIS — R350 Frequency of micturition: Secondary | ICD-10-CM

## 2022-01-19 DIAGNOSIS — N200 Calculus of kidney: Secondary | ICD-10-CM | POA: Diagnosis not present

## 2022-01-19 DIAGNOSIS — R3129 Other microscopic hematuria: Secondary | ICD-10-CM

## 2022-01-19 LAB — URINALYSIS, COMPLETE (UACMP) WITH MICROSCOPIC
Bacteria, UA: NONE SEEN
Bilirubin Urine: NEGATIVE
Glucose, UA: NEGATIVE mg/dL
Ketones, ur: NEGATIVE mg/dL
Leukocytes,Ua: NEGATIVE
Nitrite: NEGATIVE
Protein, ur: NEGATIVE mg/dL
Specific Gravity, Urine: <1.005 — ABNORMAL LOW (ref 1.005–1.030)
Squamous Epithelial / HPF: NONE SEEN (ref 0–5)
WBC, UA: NONE SEEN WBC/hpf (ref 0–5)
pH: 5.5 (ref 5.0–8.0)

## 2022-01-19 LAB — BLADDER SCAN AMB NON-IMAGING

## 2022-01-19 MED ORDER — GEMTESA 75 MG PO TABS: 75.0000 mg | ORAL_TABLET | Freq: Every day | ORAL | 0 refills | Status: DC

## 2022-01-19 NOTE — Progress Notes (Signed)
01/19/2022 10:20 AM   Chelsea Burke PARISA PINELA Jul 29, 1945 [id number]  Referring provider: Sofie Hartigan, [place],  [place] [id number]  Chief Complaint  Patient presents with   Follow-up    HPI: 76 year old female who returns today for further evaluation of urinary symptoms.  She was last seen in our office in 09/2019 for kidney stones as well as urinary urgency frequency.  He was both urge and stress incontinence.  She was recently seen and evaluated by her PCP for UTI.  She was seen on 11/27/2021, treated with Levaquin for an E. coli urinary tract infection.  At the time, her urinary symptoms were dysuria as well as urgency frequency.  The dysuria resolved but her urgency frequency persisted, slightly less then when she had the active infection.  She was seen in follow-up about 2 weeks afterwards and was treated again for presumed infection this time with Macrobid.  Time, the urinalysis was fairly bland and she ended up growing 25-50 K of strep.  She is not currently on any OAB medications.  She did have a trial of Myrbetriq after her last visit 3 months ago but felt that this actually made her symptoms worse.  In the past, she felt like her urinary symptoms were exacerbated by Aldactone.  See previous notes for details.  She does have bilateral stones, measuring up to 1.1 cm on the left, last KUB 2020.  She is asymptomatic from these.  She reports today that over the past several years, her urinary urgency has increased significantly.  She notes that when she has the urge, stands to go to the bathroom, she will often have an accident or water self, relatively large volumes.  This seems to be progressing.  She also has some lower abdominal pain which is bilateral which is not all the time.  She wonders if this is related.  She does not have any overt dysuria today.  Urinalysis today has 11-20 red blood cells, otherwise negative no evidence of infection.  She continues to  struggle with chronic constipation and takes multiple stool softeners daily.  She would like to avoid any medications to make this worse.   Results for orders placed or performed in visit on 01/19/22  Bladder Scan (Post Void Residual) in office  Result Value Ref Range   Scan Result 0ML       PMH: Past Medical History:  Diagnosis Date   Arthritis    Asthma    Depression    Dyspnea    Fatty liver    GERD (gastroesophageal reflux disease)    History of hiatal hernia    History of kidney stones    Hyperlipidemia    Hypertension     Surgical History: Past Surgical History:  Procedure Laterality Date   ABDOMINAL HYSTERECTOMY     BREAST BIOPSY Bilateral 03/2006   bil bx/clips-neg   CHOLECYSTECTOMY     COLONOSCOPY WITH PROPOFOL N/A 07/19/2017   Procedure: COLONOSCOPY WITH PROPOFOL;  Surgeon: Manya Silvas, MD;  Location: [facility] ENDOSCOPY;  Service: Endoscopy;  Laterality: N/A;   ESOPHAGOGASTRODUODENOSCOPY (EGD) WITH PROPOFOL N/A 07/19/2017   Procedure: ESOPHAGOGASTRODUODENOSCOPY (EGD) WITH PROPOFOL;  Surgeon: Manya Silvas, MD;  Location: [facility] ENDOSCOPY;  Service: Endoscopy;  Laterality: N/A;   NASAL SINUS SURGERY     NISSEN FUNDOPLICATION     TOTAL KNEE ARTHROPLASTY      Home Medications:  Allergies as of 01/19/2022       Reactions   Doxycycline  Anaphylaxis   Keflex [cephalexin] Rash   Norvasc [amlodipine] Rash   Penicillins Rash   Sulfa Antibiotics Rash        Medication List        Accurate as of January 19, 2022 10:20 AM. If you have any questions, ask your nurse or doctor.          STOP taking these medications    aspirin EC 81 MG tablet Stopped by: Hollice Espy, MD   docusate sodium 100 MG capsule Commonly known as: COLACE Stopped by: Hollice Espy, MD   mirabegron ER 50 MG Tb24 tablet Commonly known as: MYRBETRIQ Stopped by: Hollice Espy, MD       TAKE these medications    acetaminophen 650 MG CR tablet Commonly known as:  TYLENOL Take 650 mg by mouth every 8 (eight) hours as needed for pain.   atorvastatin 10 MG tablet Commonly known as: LIPITOR Take 10 mg by mouth daily.   DULoxetine 60 MG capsule Commonly known as: CYMBALTA Take 60 mg by mouth daily. What changed: Another medication with the same name was removed. Continue taking this medication, and follow the directions you see here. Changed by: Hollice Espy, MD   Lutein 20 MG Caps Take 20 mg by mouth.   spironolactone 25 MG tablet Commonly known as: ALDACTONE Take 25 mg by mouth daily.   telmisartan 80 MG tablet Commonly known as: MICARDIS Take by mouth.   traZODone 150 MG tablet Commonly known as: DESYREL Take by mouth at bedtime.        Allergies:  Allergies  Allergen Reactions   Doxycycline Anaphylaxis   Keflex [Cephalexin] Rash   Norvasc [Amlodipine] Rash   Penicillins Rash   Sulfa Antibiotics Rash    Family History: No family history on file.  Social History:  reports that she has never smoked. She has never used smokeless tobacco. She reports that she does not drink alcohol and does not use drugs.   Physical Exam: BP (!) 152/66   Pulse 82   Ht '5\' 5"'$  (1.651 m)   Wt 269 lb (122 kg)   BMI 44.76 kg/m   Constitutional:  Alert and oriented, No acute distress. HEENT: [place] AT, moist mucus membranes.  Trachea midline, no masses. Cardiovascular: No clubbing, cyanosis, or edema. Respiratory: Normal respiratory effort, no increased work of breathing. GI: Abdomen is soft, nontender, nondistended, no abdominal masses Skin: No rashes, bruises or suspicious lesions. Neurologic: Grossly intact, no focal deficits, moving all 4 extremities. Psychiatric: Normal mood and affect.  Laboratory Data: Lab Results  Component Value Date   WBC 6.1 08/10/2014   HGB 13.8 08/10/2014   HCT 41.4 08/10/2014   MCV 89.0 08/10/2014   PLT 217 08/10/2014    Lab Results  Component Value Date   CREATININE 0.67 08/10/2014     Assessment  & Plan:    1. Urinary frequency/urge incontinence Doing bladder well, no evidence of overflow incontinence  Urinalysis today is negative for evidence of infection, do not believe this is a contributing factor  She has worsening severe urgency and urge incontinence.  We discussed the mainstay of treatment for this including behavioral modifications and medications along with second line therapies.  She is previously tried and failed Myrbetriq.  She will likely not be able to tolerate anticholinergics due to her severe chronic constipation.  She was given samples of Gemtesa today, 75 mg for the next 4 to 6 weeks to try, we will reassess her urinary symptoms when she  returns.   - Bladder Scan (Post Void Residual) in office - Abdomen 1 view (KUB); Future  2. Kidney stones History of fairly large kidney stones and microscopic blood today along with recent infection  KUB to reevaluate these, we will discuss options for management if necessary at her next follow-up - Abdomen 1 view (KUB); Future  3. Microscopic hematuria As above, may consider hematuria evaluation pending the above, repeat the urinalysis   Return in about 4 weeks (around 02/16/2022) for f/u UA/ PVR in [place].  Hollice Espy, MD  [facility] [address], [place], [place] [address] [phone]

## 2022-02-16 ENCOUNTER — Other Ambulatory Visit
Admission: RE | Admit: 2022-02-16 | Discharge: 2022-02-16 | Disposition: A | Discharge status: Home / Self Care | Payer: Medicare PPO | Attending: Urology | Admitting: Urology

## 2022-02-16 ENCOUNTER — Ambulatory Visit: Payer: Medicare PPO | Admitting: Urology

## 2022-02-16 ENCOUNTER — Encounter: Payer: Self-pay | Admitting: Urology

## 2022-02-16 VITALS — BP 142/78 | HR 73 | Ht 65.0 in | Wt 269.0 lb

## 2022-02-16 DIAGNOSIS — N200 Calculus of kidney: Secondary | ICD-10-CM | POA: Diagnosis not present

## 2022-02-16 DIAGNOSIS — R350 Frequency of micturition: Secondary | ICD-10-CM

## 2022-02-16 DIAGNOSIS — R3129 Other microscopic hematuria: Secondary | ICD-10-CM

## 2022-02-16 DIAGNOSIS — N898 Other specified noninflammatory disorders of vagina: Secondary | ICD-10-CM

## 2022-02-16 LAB — URINALYSIS, COMPLETE (UACMP) WITH MICROSCOPIC
Bilirubin Urine: NEGATIVE
Glucose, UA: NEGATIVE mg/dL
Hgb urine dipstick: NEGATIVE
Ketones, ur: NEGATIVE mg/dL
Leukocytes,Ua: NEGATIVE
Nitrite: NEGATIVE
Protein, ur: NEGATIVE mg/dL
RBC / HPF: NONE SEEN RBC/hpf (ref 0–5)
Specific Gravity, Urine: 1.01 (ref 1.005–1.030)
pH: 5.5 (ref 5.0–8.0)

## 2022-02-16 LAB — BLADDER SCAN AMB NON-IMAGING

## 2022-02-16 MED ORDER — GEMTESA 75 MG PO TABS: 1.0000 | ORAL_TABLET | Freq: Every day | ORAL | 11 refills | Status: DC

## 2022-02-16 MED ORDER — ESTRADIOL 0.1 MG/GM VA CREA: 1.0000 g | TOPICAL_CREAM | VAGINAL | 12 refills | Status: DC

## 2022-02-16 NOTE — Progress Notes (Unsigned)
02/16/2022 7:33 AM   Chelsea Burke [birth year] [id number]  Referring provider: Sofie Hartigan, MD [place],  [place] [address]  Chief Complaint  Patient presents with   Urinary Incontinence   Urinary Frequency    HPI: 76 year old female who presents today for follow-up.  Since last visit, she has been taking Gemtesa and does think that it made a big difference in her urinary continence.  She knows time to get to the bathroom is having less episodes of urinary leakage.  No side effects from this medication.  She like to try this medication on a regular basis.  Terms of kidney stones, she has had no further flank pain.  KUB shows stable nephrolithiasis, left lower pole stone which is unchanged from previous imaging, large as well as small stone nonobstructing on the right.  Urinalysis today is negative, no evidence of microscopic or gross blood.  She did have some microscopic hematuria at last UA in the setting of recent UTI.  Today, she does also mention some vaginal irritation.  External discomfort/tingling at the time of voiding after voiding.  No vaginal discharge.  No bulging.  PVR 69 cc   PMH: Past Medical History:  Diagnosis Date   Arthritis    Asthma    Depression    Dyspnea    Fatty liver    GERD (gastroesophageal reflux disease)    History of hiatal hernia    History of kidney stones    Hyperlipidemia    Hypertension     Surgical History: Past Surgical History:  Procedure Laterality Date   ABDOMINAL HYSTERECTOMY     BREAST BIOPSY Bilateral 03/2006   bil bx/clips-neg   CHOLECYSTECTOMY     COLONOSCOPY WITH PROPOFOL N/A 07/19/2017   Procedure: COLONOSCOPY WITH PROPOFOL;  Surgeon: Manya Silvas, MD;  Location: [facility] ENDOSCOPY;  Service: Endoscopy;  Laterality: N/A;   ESOPHAGOGASTRODUODENOSCOPY (EGD) WITH PROPOFOL N/A 07/19/2017   Procedure: ESOPHAGOGASTRODUODENOSCOPY (EGD) WITH PROPOFOL;  Surgeon: Manya Silvas, MD;  Location: [facility]  ENDOSCOPY;  Service: Endoscopy;  Laterality: N/A;   NASAL SINUS SURGERY     NISSEN FUNDOPLICATION     TOTAL KNEE ARTHROPLASTY      Home Medications:  Allergies as of 02/16/2022       Reactions   Doxycycline Anaphylaxis   Keflex [cephalexin] Rash   Norvasc [amlodipine] Rash   Penicillins Rash   Sulfa Antibiotics Rash        Medication List        Accurate as of February 16, 2022 11:59 PM. If you have any questions, ask your nurse or doctor.          acetaminophen 650 MG CR tablet Commonly known as: TYLENOL Take 650 mg by mouth every 8 (eight) hours as needed for pain.   atorvastatin 10 MG tablet Commonly known as: LIPITOR Take 10 mg by mouth daily.   DULoxetine 60 MG capsule Commonly known as: CYMBALTA Take 60 mg by mouth daily.   estradiol 0.1 MG/GM vaginal cream Commonly known as: ESTRACE Place 1 g vaginally 3 (three) times a week. Started by: Hollice Espy, MD   Gemtesa 75 MG Tabs Generic drug: Vibegron Take 75 mg by mouth daily. What changed: Another medication with the same name was added. Make sure you understand how and when to take each. Changed by: Hollice Espy, MD   Gemtesa 75 MG Tabs Generic drug: Vibegron Take 1 tablet by mouth daily. What changed: You were already taking a  medication with the same name, and this prescription was added. Make sure you understand how and when to take each. Changed by: Hollice Espy, MD   Lutein 20 MG Caps Take 20 mg by mouth.   spironolactone 25 MG tablet Commonly known as: ALDACTONE Take 25 mg by mouth daily.   telmisartan 80 MG tablet Commonly known as: MICARDIS Take by mouth.   traZODone 150 MG tablet Commonly known as: DESYREL Take by mouth at bedtime.   Wixela Inhub 250-50 MCG/ACT Aepb Generic drug: fluticasone-salmeterol Inhale 1 puff into the lungs 2 (two) times daily.        Allergies:  Allergies  Allergen Reactions   Doxycycline Anaphylaxis   Keflex [Cephalexin] Rash   Norvasc  [Amlodipine] Rash   Penicillins Rash   Sulfa Antibiotics Rash    Family History: No family history on file.  Social History:  reports that she has never smoked. She has never used smokeless tobacco. She reports that she does not drink alcohol and does not use drugs.   Physical Exam: BP (!) 142/78 (BP Location: Left Arm, Patient Position: Sitting, Cuff Size: Large)   Pulse 73   Ht '5\' 5"'$  (1.651 m)   Wt 269 lb (122 kg)   BMI 44.76 kg/m   Constitutional:  Alert and oriented, No acute distress. HEENT: [place] AT, moist mucus membranes.  Trachea midline, no masses. Cardiovascular: No clubbing, cyanosis, or edema. Neurologic: Grossly intact, no focal deficits, moving all 4 extremities. Psychiatric: Normal mood and affect.  Laboratory Data: Lab Results  Component Value Date   WBC 6.1 08/10/2014   HGB 13.8 08/10/2014   HCT 41.4 08/10/2014   MCV 89.0 08/10/2014   PLT 217 08/10/2014    Lab Results  Component Value Date   CREATININE 0.67 08/10/2014     Urinalysis    Component Value Date/Time   COLORURINE YELLOW 02/16/2022 1000   APPEARANCEUR CLEAR 02/16/2022 1000   LABSPEC 1.010 02/16/2022 1000   PHURINE 5.5 02/16/2022 1000   GLUCOSEU NEGATIVE 02/16/2022 1000   HGBUR NEGATIVE 02/16/2022 1000   BILIRUBINUR NEGATIVE 02/16/2022 1000   KETONESUR NEGATIVE 02/16/2022 1000   PROTEINUR NEGATIVE 02/16/2022 1000   NITRITE NEGATIVE 02/16/2022 1000   LEUKOCYTESUR NEGATIVE 02/16/2022 1000    Lab Results  Component Value Date   BACTERIA FEW (A) 02/16/2022    Pertinent Imaging:  Results for orders placed during the hospital encounter of 01/19/22  Abdomen 1 view (KUB)  Narrative CLINICAL DATA:  Chronic urinary frequency bilateral nephrolithiasis  EXAM: ABDOMEN - 1 VIEW  COMPARISON:  10/10/2018  FINDINGS: Unchanged appearance of large left renal calculus measuring 12 mm. Unchanged 3 mm calculus at the right lower pole. Moderate colonic stool burden without evidence of  small bowel obstruction.  IMPRESSION: 1. Unchanged appearance of large left renal calculus measuring 12 mm. 2. 3 mm right lower pole renal calculus, unchanged   Electronically Signed By: Ulyses Jarred M.D. On: 01/22/2022 15:16  Personally reviewed the above imaging in the room with the patient was also able to review the imaging as well, compared to previous this is essentially stable.  Assessment & Plan:    1. Urinary frequency Proved on Gemtesa, will prescribe this medication.  Advised to call us if it is prohibitively expensive and we will search for other alternatives.  Previously failed Myrbetriq. - Bladder Scan (Post Void Residual) in office  2. Kidney stones Ridging reviewed, stable stones.  Likely unrelated to recurrent urinary tract infections.  Recommend continue conservative management.  She is agreeable this plan.  3. Microscopic hematuria Resolved, previous episode in close proximity to recent infection.  Will continue to monitor, recheck UA next visit  4. Vaginal irritation Suspect she likely has atrophic vaginitis, discussed topical estrogen cream 3 times a week, pea-sized amount per urethral meatus-advised to let us know if her symptoms fail to resolve in 1 to 2 months.  Discussed risk and benefits of this medication.  Return in about 1 year (around 02/17/2023) for UA/ PVR with [facility].  Hollice Espy, MD  [facility] [address], [place], [place] [address] [phone]

## 2022-02-28 DIAGNOSIS — M65331 Trigger finger, right middle finger: Secondary | ICD-10-CM | POA: Diagnosis not present

## 2022-03-19 DIAGNOSIS — N329 Bladder disorder, unspecified: Secondary | ICD-10-CM

## 2022-03-19 HISTORY — DX: Bladder disorder, unspecified: N32.9

## 2022-03-26 ENCOUNTER — Encounter: Payer: Self-pay | Admitting: Urology

## 2022-03-30 ENCOUNTER — Other Ambulatory Visit: Payer: Self-pay | Admitting: Urology

## 2022-03-30 DIAGNOSIS — R350 Frequency of micturition: Secondary | ICD-10-CM

## 2022-03-30 DIAGNOSIS — N200 Calculus of kidney: Secondary | ICD-10-CM

## 2022-03-30 NOTE — Progress Notes (Signed)
04/02/2022 6:58 PM   Chelsea Burke 05-21-1945 [id number]  Referring provider: Sofie Hartigan, MD [place],  [place] [address]  Urological history: 1.  Nephrolithiasis -stone composition unknown -KUB (03/2022) Bilateral nephrolithiasis.   2. OAB -Contributing factors of age, vaginal atrophy, obesity, hypertension, arthritis, depression, diuretics and asthma -PVR 42 mL  -Gemtesa 75 mg daily  3. Incontinence -both urge/stress -Contributing factors of age, vaginal atrophy, obesity, hypertension, arthritis, depression, diuretics and asthma -PVR 42 mL -Gemtesa 75 mg daily  Chief Complaint  Patient presents with   Nephrolithiasis   Over Active Bladder    HPI: Chelsea Burke is a 77 y.o. female who presents today for pain w/ urination.   UA Yellow hazy, specific gravity 1.015, pH 5.5, moderate blood, trace leukocytes, 0-5 squames, 11-20 WBC's, 11-20 RBC's, few bacteria and WBC clumps present.    PVR 42 mL   KUB bilateral nephrolithiasis.   She is having pain with urination.  She describes the pain as the sensation of a vein collapsing at the end of her stream.  The pain quickly abates once the void is completed.   This pain has been occurring for several weeks.  Patient denies any modifying or aggravating factors.  Patient denies any gross hematuria, dysuria or suprapubic/flank pain.  Patient denies any fevers, chills, nausea or vomiting.    Her mother had bladder cancer that was discovered when she was in her 77's.  She was a non-smoker, but she had radiation for uterine cancer.    She continues to have issues with incontinence and is wearing pads.  She states the incontinence has worsened over the last month.  She felt the [place] Bores was effective at first but, its effectiveness has worn off.    PMH: Past Medical History:  Diagnosis Date   Arthritis    Asthma    Depression    Dyspnea    Fatty liver    GERD (gastroesophageal reflux disease)    History of  hiatal hernia    History of kidney stones    Hyperlipidemia    Hypertension     Surgical History: Past Surgical History:  Procedure Laterality Date   ABDOMINAL HYSTERECTOMY     BREAST BIOPSY Bilateral 03/2006   bil bx/clips-neg   CHOLECYSTECTOMY     COLONOSCOPY WITH PROPOFOL N/A 07/19/2017   Procedure: COLONOSCOPY WITH PROPOFOL;  Surgeon: Manya Silvas, MD;  Location: [facility] ENDOSCOPY;  Service: Endoscopy;  Laterality: N/A;   ESOPHAGOGASTRODUODENOSCOPY (EGD) WITH PROPOFOL N/A 07/19/2017   Procedure: ESOPHAGOGASTRODUODENOSCOPY (EGD) WITH PROPOFOL;  Surgeon: Manya Silvas, MD;  Location: [facility] ENDOSCOPY;  Service: Endoscopy;  Laterality: N/A;   NASAL SINUS SURGERY     NISSEN FUNDOPLICATION     TOTAL KNEE ARTHROPLASTY      Home Medications:  Allergies as of 04/02/2022       Reactions   Doxycycline Anaphylaxis   Keflex [cephalexin] Rash   Norvasc [amlodipine] Rash   Penicillins Rash   Sulfa Antibiotics Rash        Medication List        Accurate as of April 02, 2022 11:59 PM. If you have any questions, ask your nurse or doctor.          acetaminophen 650 MG CR tablet Commonly known as: TYLENOL Take 650 mg by mouth every 8 (eight) hours as needed for pain.   atorvastatin 20 MG tablet Commonly known as: LIPITOR Take 20 mg by mouth daily.  DULoxetine 60 MG capsule Commonly known as: CYMBALTA Take 60 mg by mouth daily.   estradiol 0.1 MG/GM vaginal cream Commonly known as: ESTRACE Place 1 g vaginally 3 (three) times a week.   Gemtesa 75 MG Tabs Generic drug: Vibegron Take 1 tablet by mouth daily.   Lutein 20 MG Caps Take 20 mg by mouth.   nystatin cream Commonly known as: MYCOSTATIN Apply 1 Application topically 2 (two) times daily. Started by: Zara Council, PA-C   spironolactone 25 MG tablet Commonly known as: ALDACTONE Take 25 mg by mouth daily.   telmisartan 80 MG tablet Commonly known as: MICARDIS Take by mouth.   traZODone 50 MG  tablet Commonly known as: DESYREL Take 50 mg by mouth at bedtime.   Wixela Inhub 250-50 MCG/ACT Aepb Generic drug: fluticasone-salmeterol Inhale 1 puff into the lungs 2 (two) times daily.        Allergies:  Allergies  Allergen Reactions   Doxycycline Anaphylaxis   Keflex [Cephalexin] Rash   Norvasc [Amlodipine] Rash   Penicillins Rash   Sulfa Antibiotics Rash    Family History: No family history on file.  Social History:  reports that she has never smoked. She has never used smokeless tobacco. She reports that she does not drink alcohol and does not use drugs.  ROS: Pertinent ROS in HPI  Physical Exam: BP (!) 160/72 (BP Location: Left Arm, Patient Position: Sitting, Cuff Size: Large)   Pulse 78   Ht 5' 5"$  (1.651 m)   Wt 268 lb (121.6 kg)   BMI 44.60 kg/m   Constitutional:  Well nourished. Alert and oriented, No acute distress. HEENT: Chelsea Burke AT, moist mucus membranes.  Trachea midline Cardiovascular: No clubbing, cyanosis, or edema. Respiratory: Normal respiratory effort, no increased work of breathing. GU: No CVA tenderness.  No bladder fullness or masses.  Satellite lesions located in the folds of the panus and groin area.   Neurologic: Grossly intact, no focal deficits, moving all 4 extremities. Psychiatric: Normal mood and affect.    Laboratory Data: Glucose 70 - 110 mg/dL 101  Sodium 136 - 145 mmol/L 142  Potassium 3.6 - 5.1 mmol/L 5.2 High   Chloride 97 - 109 mmol/L 108  Carbon Dioxide (CO2) 22.0 - 32.0 mmol/L 29.0  Urea Nitrogen (BUN) 7 - 25 mg/dL 17  Creatinine 0.6 - 1.1 mg/dL 0.9  Glomerular Filtration Rate (eGFR) >60 mL/min/1.73sq m 66  Comment: CKD-EPI (2021) does not include patient's race in the calculation of eGFR.  Monitoring changes of plasma creatinine and eGFR over time is useful for monitoring kidney function.  Interpretive Ranges for eGFR (CKD-EPI 2021):  eGFR:       >60 mL/min/1.73 sq. m - Normal eGFR:       30-59 mL/min/1.73 sq. m -  Moderately Decreased eGFR:       15-29 mL/min/1.73 sq. m  - Severely Decreased eGFR:       < 15 mL/min/1.73 sq. m  - Kidney Failure   Note: These eGFR calculations do not apply in acute situations when eGFR is changing rapidly or patients on dialysis.  Calcium 8.7 - 10.3 mg/dL 9.5  AST 8 - 39 U/L 18  ALT 5 - 38 U/L 19  Alk Phos (alkaline Phosphatase) 34 - 104 U/L 65  Albumin 3.5 - 4.8 g/dL 4.2  Bilirubin, Total 0.3 - 1.2 mg/dL 0.8  Protein, Total 6.1 - 7.9 g/dL 6.2  A/G Ratio 1.0 - 5.0 gm/dL 2.1  [facility] [place] -  LAB   Specimen Collected: 01/15/22 09:44   Performed by: [place]: 01/15/22 17:43  Received From: [place]  Result Received: 01/17/22 13:56   Cholesterol, Total 100 - 200 mg/dL 131  Triglyceride 35 - 199 mg/dL 92  HDL (High Density Lipoprotein) Cholesterol 35.0 - 85.0 mg/dL 57.1  LDL Calculated 0 - 130 mg/dL 56  VLDL Cholesterol mg/dL 18  Cholesterol/HDL Ratio  2.3  [place] - LAB   Specimen Collected: 01/15/22 09:44   Performed by: [place]: 01/15/22 16:43  Received From: [place]  Result Received: 01/17/22 13:56   Hemoglobin A1C 4.2 - 5.6 % 5.5  Average Blood Glucose (Calc) mg/dL 111  [place] - LAB  Narrative Performed by [facility] - LAB Normal Range:    4.2 - 5.6% Increased Risk:  5.7 - 6.4% Diabetes:        >= 6.5% Glycemic Control for adults with diabetes:  <7%    Specimen Collected: 01/15/22 09:44   Performed by: [place]: 01/15/22 13:04  Received From: [place]  Result Received: 01/17/22 13:56   Urinalysis Component     Latest Ref Rng 04/02/2022  Color, Urine     YELLOW  YELLOW   Appearance     CLEAR  HAZY !   Specific Gravity, Urine     1.005 - 1.030  1.015   pH     5.0 - 8.0  5.5   Glucose,  UA     NEGATIVE mg/dL NEGATIVE   Hgb urine dipstick     NEGATIVE  MODERATE !   Bilirubin Urine     NEGATIVE  NEGATIVE   Ketones, ur     NEGATIVE mg/dL NEGATIVE   Protein     NEGATIVE mg/dL NEGATIVE   Nitrite     NEGATIVE  NEGATIVE   Leukocytes,Ua     NEGATIVE  TRACE !   Squamous Epithelial / HPF     0 - 5 /HPF 0-5   WBC, UA     0 - 5 WBC/hpf 11-20   RBC / HPF     0 - 5 RBC/hpf 11-20   Bacteria, UA     NONE SEEN  FEW !   WBC Clumps PRESENT     Legend: ! Abnormal I have reviewed the labs.   Pertinent Imaging: CLINICAL DATA:  Nephrolithiasis.   EXAM: ABDOMEN - 1 VIEW   COMPARISON:  01/19/2022   FINDINGS: The bowel gas pattern is normal.  There are cholecystectomy clips.   Two stones overlie the left kidney measuring 1.4 cm and 0.7 cm. One stone on the right measuring 0.5 cm.   IMPRESSION: Bilateral nephrolithiasis.     Electronically Signed   By: Sammie Bench M.D.   On: 04/02/2022 14:04 I have independently reviewed the films.    Assessment & Plan:    1. Dysuria -she continues to experience this "pinching sensation" since September that has not responded to antibiotics, vaginal estrogen cream or Gemtesa -explained that she has a low risk for bladder cancer as she is not a smoker and her mother's bladder cancer likely resulted from the radiation -UA w/ pyuria, hematuria and WBC clumps -urine sent for culture -will schedule a cystoscopy with Dr. Erlene Quan for further investigation into symptoms -I have explained to the patient that they will  be  scheduled for a cystoscopy in our office to evaluate their bladder.  The cystoscopy consists of passing a tube with a lens up through their urethra and into their urinary bladder.   We will inject the urethra with a lidocaine gel prior to introducing the cystoscope to help with any discomfort during the procedure.   After the procedure, they might experience blood in the urine and discomfort with urination.  This will  abate after the first few voids.  I have  encouraged the patient to increase water intake  during this time.  Patient denies any allergies to lidocaine.    2. Vaginal atrophy -continue vaginal estrogen cream  3. Incontinence -continue Gemtesa for now -if cysto is negative, will reassess on return appointment  4. Nephrolithiasis -bilateral nephrolithiasis  Return in about 2 weeks (around 04/16/2022) for cysto .  These notes generated with voice recognition software. I apologize for typographical errors.  [place], [place] [address]  [place], [place] [address] [phone]

## 2022-04-02 ENCOUNTER — Encounter: Payer: Self-pay | Admitting: Urology

## 2022-04-02 ENCOUNTER — Ambulatory Visit
Admission: RE | Admit: 2022-04-02 | Discharge: 2022-04-02 | Disposition: A | Discharge status: Home / Self Care | Payer: Medicare PPO | Source: Ambulatory Visit | Attending: Urology | Admitting: Urology

## 2022-04-02 ENCOUNTER — Other Ambulatory Visit
Admission: RE | Admit: 2022-04-02 | Discharge: 2022-04-02 | Disposition: A | Discharge status: Home / Self Care | Payer: Medicare PPO | Source: Home / Self Care | Attending: Urology | Admitting: Urology

## 2022-04-02 ENCOUNTER — Ambulatory Visit
Admission: RE | Admit: 2022-04-02 | Discharge: 2022-04-02 | Disposition: A | Discharge status: Home / Self Care | Payer: Medicare PPO | Attending: Urology | Admitting: Urology

## 2022-04-02 ENCOUNTER — Other Ambulatory Visit: Payer: Self-pay

## 2022-04-02 ENCOUNTER — Ambulatory Visit: Payer: Medicare PPO | Admitting: Urology

## 2022-04-02 VITALS — BP 160/72 | HR 78 | Ht 65.0 in | Wt 268.0 lb

## 2022-04-02 DIAGNOSIS — N952 Postmenopausal atrophic vaginitis: Secondary | ICD-10-CM

## 2022-04-02 DIAGNOSIS — N200 Calculus of kidney: Secondary | ICD-10-CM | POA: Diagnosis not present

## 2022-04-02 DIAGNOSIS — B356 Tinea cruris: Secondary | ICD-10-CM

## 2022-04-02 DIAGNOSIS — R32 Unspecified urinary incontinence: Secondary | ICD-10-CM

## 2022-04-02 DIAGNOSIS — R350 Frequency of micturition: Secondary | ICD-10-CM | POA: Insufficient documentation

## 2022-04-02 DIAGNOSIS — R300 Dysuria: Secondary | ICD-10-CM

## 2022-04-02 DIAGNOSIS — Z9049 Acquired absence of other specified parts of digestive tract: Secondary | ICD-10-CM | POA: Diagnosis not present

## 2022-04-02 LAB — URINALYSIS, COMPLETE (UACMP) WITH MICROSCOPIC
Bilirubin Urine: NEGATIVE
Glucose, UA: NEGATIVE mg/dL
Ketones, ur: NEGATIVE mg/dL
Nitrite: NEGATIVE
Protein, ur: NEGATIVE mg/dL
Specific Gravity, Urine: 1.015 (ref 1.005–1.030)
pH: 5.5 (ref 5.0–8.0)

## 2022-04-02 LAB — BLADDER SCAN AMB NON-IMAGING

## 2022-04-02 MED ORDER — NYSTATIN 100000 UNIT/GM EX CREA: 1.0000 | TOPICAL_CREAM | Freq: Two times a day (BID) | CUTANEOUS | 0 refills | Status: DC

## 2022-04-02 NOTE — Patient Instructions (Signed)

## 2022-04-04 ENCOUNTER — Telehealth: Payer: Self-pay | Admitting: Family Medicine

## 2022-04-04 LAB — URINE CULTURE: Culture: 30000 — AB

## 2022-04-04 MED ORDER — CIPROFLOXACIN HCL 250 MG PO TABS: 250.0000 mg | ORAL_TABLET | Freq: Two times a day (BID) | ORAL | 0 refills | Status: DC

## 2022-04-04 NOTE — Telephone Encounter (Signed)
Patient notified and voiced understanding. RX sent to pharmacy. 

## 2022-04-04 NOTE — Telephone Encounter (Signed)
-----  Message from Nori Riis, PA-C sent at 04/04/2022  8:18 AM EST ----- Please let Chelsea Burke know that her urine culture grew out a bacteria and we need to treat it as she is having a cystoscopy.  I would like her to start Cipro 250 mg, twice daily for seven days.

## 2022-04-20 ENCOUNTER — Other Ambulatory Visit: Payer: Self-pay | Admitting: Urology

## 2022-04-20 ENCOUNTER — Ambulatory Visit (INDEPENDENT_AMBULATORY_CARE_PROVIDER_SITE_OTHER): Payer: Medicare PPO | Admitting: Urology

## 2022-04-20 ENCOUNTER — Encounter: Payer: Self-pay | Admitting: Urology

## 2022-04-20 ENCOUNTER — Other Ambulatory Visit
Admission: RE | Admit: 2022-04-20 | Discharge: 2022-04-20 | Disposition: A | Discharge status: Home / Self Care | Payer: Medicare PPO | Attending: Urology | Admitting: Urology

## 2022-04-20 ENCOUNTER — Telehealth: Payer: Self-pay

## 2022-04-20 ENCOUNTER — Other Ambulatory Visit: Payer: Self-pay | Admitting: *Deleted

## 2022-04-20 VITALS — BP 144/78 | HR 80 | Ht 65.0 in | Wt 267.0 lb

## 2022-04-20 DIAGNOSIS — N200 Calculus of kidney: Secondary | ICD-10-CM | POA: Insufficient documentation

## 2022-04-20 DIAGNOSIS — R3129 Other microscopic hematuria: Secondary | ICD-10-CM

## 2022-04-20 DIAGNOSIS — R350 Frequency of micturition: Secondary | ICD-10-CM

## 2022-04-20 DIAGNOSIS — N3289 Other specified disorders of bladder: Secondary | ICD-10-CM

## 2022-04-20 DIAGNOSIS — N329 Bladder disorder, unspecified: Secondary | ICD-10-CM

## 2022-04-20 LAB — URINALYSIS, COMPLETE (UACMP) WITH MICROSCOPIC
Bilirubin Urine: NEGATIVE
Glucose, UA: NEGATIVE mg/dL
Leukocytes,Ua: NEGATIVE
Nitrite: NEGATIVE
Protein, ur: NEGATIVE mg/dL
Specific Gravity, Urine: 1.025 (ref 1.005–1.030)
pH: 5.5 (ref 5.0–8.0)

## 2022-04-20 NOTE — H&P (View-Only) (Signed)
   04/20/22  CC:  Chief Complaint  Patient presents with   Cysto    HPI: 76-year-presents for cystoscopy.  She has recurrent UTIs and microscopic hematuria.  She has no urinary symptoms today other than incontinence but does have persistent white blood cells in her urine.  She mentions today that her mother had bladder cancer and her brother recently was diagnosed as well.  Blood pressure (!) 144/78, pulse 80, height '5\' 5"'$  (1.651 m), weight 267 lb (121.1 kg). NED. A&Ox3.   No respiratory distress   Abd soft, NT, ND Normal external genitalia with patent urethral meatus  Cystoscopy Procedure Note  Patient identification was confirmed, informed consent was obtained, and patient was prepped using Betadine solution.  Lidocaine jelly was administered per urethral meatus.    Procedure: - Flexible cystoscope introduced, without any difficulty.   - Thorough search of the bladder revealed:    normal urethral meatus    In the trigone.  There is also an area of raised change, approximately 5 mm beyond the right trigone/bladder neck which is raised and whitish.    no stones    no ulcers     no tumors    no urethral polyps    Mild trabeculation  - Ureteral orifices were normal in position and appearance.  Post-Procedure: - Patient tolerated the procedure well  Assessment/ Plan:  1. 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  We could also plan for bilateral retrograde pyelogram.  Risk and benefits were discussed in detail including risk of bleeding, infection demonstrating structures amongst others.  All questions were answered.  She is willing to proceed as planned.  Preop urine culture today.  Will also plan on renal ultrasound to rule out any underlying renal masses  although less likely. - US RENAL; Future - Urine Culture; Future  2. Kidney stones Stable, asymptomatic  3. Urinary frequency Please see previous notes    Hollice Espy, MD

## 2022-04-20 NOTE — Progress Notes (Signed)
Surgical Physician Order Form [facility]  * Scheduling expectation : Next Available  *Length of Case:   *Clearance needed: no  *Anticoagulation Instructions: Hold all anticoagulants  *Aspirin Instructions: Hold Aspirin  *Post-op visit Date/Instructions:   will call with results  *Diagnosis: Bladder Lesion  *Procedure: bilateral RTG, Cysto Bladder Biopsy ([id number])   Additional orders: N/A  -Admit type: OUTpatient  -Anesthesia: General  -VTE Prophylaxis Standing Order SCD's       Other:   -Standing Lab Orders Per Anesthesia    Lab other: None  -Standing Test orders EKG/Chest x-ray per Anesthesia       Test other:   - Medications: Cipro 400 mg IV  -Other orders:  N/A

## 2022-04-20 NOTE — Telephone Encounter (Signed)
I spoke with Mrs. Chelsea Burke. We have discussed possible surgery dates and Monday February 12th, 2024 was agreed upon by all parties. Patient given information about surgery date, what to expect pre-operatively and Burke operatively.  We discussed that a Pre-Admission Testing office will be calling to set up the pre-op visit that will take place prior to surgery, and that these appointments are typically done over the phone with a Pre-Admissions RN. Informed patient that our office will communicate any additional care to be provided after surgery. Patients questions or concerns were discussed during our call. Advised to call our office should there be any additional information, questions or concerns that arise. Patient verbalized understanding.

## 2022-04-20 NOTE — Progress Notes (Signed)
   04/20/22  CC:  Chief Complaint  Patient presents with   Cysto    HPI: 76-year-presents for cystoscopy.  She has recurrent UTIs and microscopic hematuria.  She has no urinary symptoms today other than incontinence but does have persistent white blood cells in her urine.  She mentions today that her mother had bladder cancer and her brother recently was diagnosed as well.  Blood pressure (!) 144/78, pulse 80, height '5\' 5"'$  (1.651 m), weight 267 lb (121.1 kg). NED. A&Ox3.   No respiratory distress   Abd soft, NT, ND Normal external genitalia with patent urethral meatus  Cystoscopy Procedure Note  Patient identification was confirmed, informed consent was obtained, and patient was prepped using Betadine solution.  Lidocaine jelly was administered per urethral meatus.    Procedure: - Flexible cystoscope introduced, without any difficulty.   - Thorough search of the bladder revealed:    normal urethral meatus    In the trigone.  There is also an area of raised change, approximately 5 mm beyond the right trigone/bladder neck which is raised and whitish.    no stones    no ulcers     no tumors    no urethral polyps    Mild trabeculation  - Ureteral orifices were normal in position and appearance.  Post-Procedure: - Patient tolerated the procedure well  Assessment/ Plan:  1. 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  We could also plan for bilateral retrograde pyelogram.  Risk and benefits were discussed in detail including risk of bleeding, infection demonstrating structures amongst others.  All questions were answered.  She is willing to proceed as planned.  Preop urine culture today.  Will also plan on renal ultrasound to rule out any underlying renal masses  although less likely. - US RENAL; Future - Urine Culture; Future  2. Kidney stones Stable, asymptomatic  3. Urinary frequency Please see previous notes    Hollice Espy, MD

## 2022-04-20 NOTE — Progress Notes (Signed)
   [facility]  Surgery Date: Date: 04/30/2022  Surgeon: Dr. Hollice Espy, MD  Inpt ( No  )   Outpt (Yes)   Obs ( No  )   Diagnosis: N32.9 Bladder Lesion  -CPT: 76734, [phone]  Surgery: Cystoscopy with Bladder Biopsy and Bilateral Retrograde Pyelograms  Stop Anticoagulations: Yes and hold ASA  Cardiac/Medical/Pulmonary Clearance needed:no  *Orders entered into EPIC  Date: 04/20/22   *Case booked in EPIC  Date: 04/20/22  *Notified pt of Surgery: Date: 04/20/22  PRE-OP UA & CX: no  *Placed into Prior Authorization Work [place] Date: 04/20/22  Assistant/laser/rep:No

## 2022-04-21 LAB — URINE CULTURE: Culture: NO GROWTH

## 2022-04-24 ENCOUNTER — Encounter
Admission: RE | Admit: 2022-04-24 | Discharge: 2022-04-24 | Disposition: A | Discharge status: Home / Self Care | Payer: Medicare PPO | Source: Ambulatory Visit | Attending: Urology | Admitting: Urology

## 2022-04-24 VITALS — Ht 65.0 in | Wt 267.0 lb

## 2022-04-24 DIAGNOSIS — I10 Essential (primary) hypertension: Secondary | ICD-10-CM

## 2022-04-24 DIAGNOSIS — E782 Mixed hyperlipidemia: Secondary | ICD-10-CM

## 2022-04-24 DIAGNOSIS — Z01812 Encounter for preprocedural laboratory examination: Secondary | ICD-10-CM

## 2022-04-24 HISTORY — DX: Cortical age-related cataract, unspecified eye: H25.019

## 2022-04-24 HISTORY — DX: Mixed hyperlipidemia: E78.2

## 2022-04-24 HISTORY — DX: Essential (primary) hypertension: I10

## 2022-04-24 HISTORY — DX: Pneumonia, unspecified organism: J18.9

## 2022-04-24 HISTORY — DX: Atherosclerosis of aorta: I70.0

## 2022-04-24 HISTORY — DX: Morbid (severe) obesity due to excess calories: E66.01

## 2022-04-24 NOTE — Patient Instructions (Addendum)
Your procedure is scheduled on: Monday, February 12 Report to the Registration Desk on the 1st floor of the [facility]. To find out your arrival time, please call [phone] between 1PM - 3PM on: Friday, February 9 If your arrival time is 6:00 am, do not arrive before that time as the [facility] entrance doors do not open until 6:00 am.  REMEMBER: Instructions that are not followed completely may result in serious medical risk, up to and including death; or upon the discretion of your surgeon and anesthesiologist your surgery may need to be rescheduled.  Do not eat or drink after midnight the night before surgery.  No gum chewing or hard candies.  One week prior to surgery: starting February 6 Stop Anti-inflammatories (NSAIDS) such as Advil, Aleve, Ibuprofen, Motrin, Naproxen, Naprosyn and Aspirin based products such as Excedrin, Goody's Powder, BC Powder. Stop ANY OVER THE COUNTER supplements until after surgery. Stop Lutein, vitamin C, vitamin D, flaxseed oil You may however, continue to take Tylenol if needed for pain up until the day of surgery.  Continue taking all prescribed medications.  TAKE THESE MEDICATIONS THE MORNING OF SURGERY WITH A SIP OF WATER:  Duloxetine (Cymbalta) Wixela inhaler  No Alcohol for 24 hours before or after surgery.  No Smoking including e-cigarettes for 24 hours before surgery.  No chewable tobacco products for at least 6 hours before surgery.  No nicotine patches on the day of surgery.  Do not use any "recreational" drugs for at least a week (preferably 2 weeks) before your surgery.  Please be advised that the combination of cocaine and anesthesia may have negative outcomes, up to and including death. If you test positive for cocaine, your surgery will be cancelled.  On the morning of surgery brush your teeth with toothpaste and water, you may rinse your mouth with mouthwash if you wish. Do not swallow any toothpaste or mouthwash.  Do not  wear jewelry, make-up, hairpins, clips or nail polish.  Do not wear lotions, powders, or perfumes.   Do not shave body hair from the neck down 48 hours before surgery.  Contact lenses, hearing aids and dentures may not be worn into surgery.  Do not bring valuables to the hospital. [facility] is not responsible for any missing/lost belongings or valuables.   Notify your doctor if there is any change in your medical condition (cold, fever, infection).  Wear comfortable clothing (specific to your surgery type) to the hospital.  After surgery, you can help prevent lung complications by doing breathing exercises.  Take deep breaths and cough every 1-2 hours. Your doctor may order a device called an Incentive Spirometer to help you take deep breaths.  If you are being discharged the day of surgery, you will not be allowed to drive home. You will need a responsible individual to drive you home and stay with you for 24 hours after surgery.   If you are taking public transportation, you will need to have a responsible individual with you.  Please call the [facility] Dept. at [phone] if you have any questions about these instructions.  Surgery Visitation Policy:  Patients undergoing a surgery or procedure may have two family members or support persons with them as long as the person is not COVID-19 positive or experiencing its symptoms.

## 2022-04-25 ENCOUNTER — Ambulatory Visit
Admission: RE | Admit: 2022-04-25 | Discharge: 2022-04-25 | Disposition: A | Discharge status: Home / Self Care | Payer: Medicare PPO | Source: Ambulatory Visit | Attending: Urology | Admitting: Urology

## 2022-04-25 DIAGNOSIS — R3129 Other microscopic hematuria: Secondary | ICD-10-CM | POA: Diagnosis not present

## 2022-04-26 ENCOUNTER — Encounter
Admission: RE | Admit: 2022-04-26 | Discharge: 2022-04-26 | Disposition: A | Discharge status: Home / Self Care | Payer: Medicare PPO | Source: Ambulatory Visit | Attending: Urology | Admitting: Urology

## 2022-04-26 DIAGNOSIS — I10 Essential (primary) hypertension: Secondary | ICD-10-CM | POA: Insufficient documentation

## 2022-04-26 DIAGNOSIS — I498 Other specified cardiac arrhythmias: Secondary | ICD-10-CM | POA: Insufficient documentation

## 2022-04-26 DIAGNOSIS — Z01810 Encounter for preprocedural cardiovascular examination: Secondary | ICD-10-CM | POA: Diagnosis not present

## 2022-04-26 DIAGNOSIS — E782 Mixed hyperlipidemia: Secondary | ICD-10-CM | POA: Insufficient documentation

## 2022-04-26 DIAGNOSIS — Z01818 Encounter for other preprocedural examination: Secondary | ICD-10-CM | POA: Insufficient documentation

## 2022-04-26 DIAGNOSIS — Z01812 Encounter for preprocedural laboratory examination: Secondary | ICD-10-CM

## 2022-04-26 LAB — CBC
HCT: 41.1 % (ref 36.0–46.0)
Hemoglobin: 13.3 g/dL (ref 12.0–15.0)
MCH: 30 pg (ref 26.0–34.0)
MCHC: 32.4 g/dL (ref 30.0–36.0)
MCV: 92.8 fL (ref 80.0–100.0)
Platelets: 216 10*3/uL (ref 150–400)
RBC: 4.43 MIL/uL (ref 3.87–5.11)
RDW: 12.7 % (ref 11.5–15.5)
WBC: 7 10*3/uL (ref 4.0–10.5)
nRBC: 0 % (ref 0.0–0.2)

## 2022-04-26 LAB — BASIC METABOLIC PANEL
Anion gap: 10 (ref 5–15)
BUN: 14 mg/dL (ref 8–23)
CO2: 25 mmol/L (ref 22–32)
Calcium: 9 mg/dL (ref 8.9–10.3)
Chloride: 104 mmol/L (ref 98–111)
Creatinine, Ser: 0.69 mg/dL (ref 0.44–1.00)
GFR, Estimated: >60 mL/min (ref >60)
Glucose, Bld: 112 mg/dL — ABNORMAL HIGH (ref 70–99)
Potassium: 3.3 mmol/L — ABNORMAL LOW (ref 3.5–5.1)
Sodium: 139 mmol/L (ref 135–145)

## 2022-04-30 ENCOUNTER — Ambulatory Visit: Payer: Medicare PPO | Admitting: Urgent Care

## 2022-04-30 ENCOUNTER — Ambulatory Visit: Payer: Medicare PPO

## 2022-04-30 ENCOUNTER — Ambulatory Visit
Admission: RE | Admit: 2022-04-30 | Discharge: 2022-04-30 | Disposition: A | Discharge status: Home / Self Care | Payer: Medicare PPO | Source: Ambulatory Visit | Attending: Urology | Admitting: Urology

## 2022-04-30 ENCOUNTER — Ambulatory Visit: Payer: Medicare PPO | Admitting: Certified Registered"

## 2022-04-30 ENCOUNTER — Encounter: Payer: Self-pay | Admitting: Urology

## 2022-04-30 ENCOUNTER — Other Ambulatory Visit: Payer: Self-pay

## 2022-04-30 ENCOUNTER — Encounter
Admission: RE | Disposition: A | Discharge status: Home / Self Care | Payer: Self-pay | Source: Ambulatory Visit | Attending: Urology

## 2022-04-30 DIAGNOSIS — Z87440 Personal history of urinary (tract) infections: Secondary | ICD-10-CM | POA: Diagnosis not present

## 2022-04-30 DIAGNOSIS — N200 Calculus of kidney: Secondary | ICD-10-CM | POA: Diagnosis not present

## 2022-04-30 DIAGNOSIS — Z8052 Family history of malignant neoplasm of bladder: Secondary | ICD-10-CM | POA: Diagnosis not present

## 2022-04-30 DIAGNOSIS — N3030 Trigonitis without hematuria: Secondary | ICD-10-CM | POA: Insufficient documentation

## 2022-04-30 DIAGNOSIS — C679 Malignant neoplasm of bladder, unspecified: Secondary | ICD-10-CM | POA: Insufficient documentation

## 2022-04-30 DIAGNOSIS — N329 Bladder disorder, unspecified: Secondary | ICD-10-CM | POA: Diagnosis not present

## 2022-04-30 HISTORY — PX: CYSTOSCOPY W/ RETROGRADES: SHX1426

## 2022-04-30 HISTORY — PX: CYSTOSCOPY WITH BIOPSY: SHX5122

## 2022-04-30 SURGERY — CYSTOSCOPY, WITH BIOPSY
Anesthesia: General | Site: Bladder

## 2022-04-30 MED ORDER — OXYCODONE HCL 5 MG/5ML PO SOLN: 5.0000 mg | Freq: Once | ORAL | Status: DC | PRN

## 2022-04-30 MED ORDER — HYDRALAZINE HCL 20 MG/ML IJ SOLN
INTRAMUSCULAR | Status: AC
  Filled 2022-04-30: qty 1

## 2022-04-30 MED ORDER — FENTANYL CITRATE (PF) 100 MCG/2ML IJ SOLN
INTRAMUSCULAR | Status: DC | PRN
  Administered 2022-04-30: 50 ug via INTRAVENOUS

## 2022-04-30 MED ORDER — SUGAMMADEX SODIUM 200 MG/2ML IV SOLN
INTRAVENOUS | Status: DC | PRN
  Administered 2022-04-30: 400 mg via INTRAVENOUS

## 2022-04-30 MED ORDER — DIPHENHYDRAMINE HCL 50 MG/ML IJ SOLN
INTRAMUSCULAR | Status: DC | PRN
  Administered 2022-04-30: 25 mg via INTRAVENOUS

## 2022-04-30 MED ORDER — FENTANYL CITRATE (PF) 100 MCG/2ML IJ SOLN: 25.0000 ug | INTRAMUSCULAR | Status: DC | PRN

## 2022-04-30 MED ORDER — ORAL CARE MOUTH RINSE: 15.0000 mL | Freq: Once | OROMUCOSAL | Status: AC

## 2022-04-30 MED ORDER — PROPOFOL 10 MG/ML IV BOLUS
INTRAVENOUS | Status: DC | PRN
  Administered 2022-04-30: 150 mg via INTRAVENOUS

## 2022-04-30 MED ORDER — HYDRALAZINE HCL 20 MG/ML IJ SOLN
10.0000 mg | INTRAMUSCULAR | Status: AC
  Administered 2022-04-30: 10 mg via INTRAVENOUS

## 2022-04-30 MED ORDER — ROCURONIUM BROMIDE 100 MG/10ML IV SOLN
INTRAVENOUS | Status: DC | PRN
  Administered 2022-04-30: 50 mg via INTRAVENOUS

## 2022-04-30 MED ORDER — FAMOTIDINE 20 MG PO TABS
ORAL_TABLET | ORAL | Status: AC
  Administered 2022-04-30: 20 mg via ORAL
  Filled 2022-04-30: qty 1

## 2022-04-30 MED ORDER — DEXAMETHASONE SODIUM PHOSPHATE 10 MG/ML IJ SOLN
INTRAMUSCULAR | Status: DC | PRN
  Administered 2022-04-30: 10 mg via INTRAVENOUS

## 2022-04-30 MED ORDER — CHLORHEXIDINE GLUCONATE 0.12 % MT SOLN: 15.0000 mL | Freq: Once | OROMUCOSAL | Status: AC

## 2022-04-30 MED ORDER — CIPROFLOXACIN IN D5W 400 MG/200ML IV SOLN
400.0000 mg | INTRAVENOUS | Status: AC
  Administered 2022-04-30: 400 mg via INTRAVENOUS

## 2022-04-30 MED ORDER — CIPROFLOXACIN IN D5W 400 MG/200ML IV SOLN
INTRAVENOUS | Status: AC
  Filled 2022-04-30: qty 200

## 2022-04-30 MED ORDER — FAMOTIDINE 20 MG PO TABS: 20.0000 mg | ORAL_TABLET | Freq: Once | ORAL | Status: AC

## 2022-04-30 MED ORDER — STERILE WATER FOR IRRIGATION IR SOLN
Status: DC | PRN
  Administered 2022-04-30: 3000 mL

## 2022-04-30 MED ORDER — LIDOCAINE HCL (CARDIAC) PF 100 MG/5ML IV SOSY
PREFILLED_SYRINGE | INTRAVENOUS | Status: DC | PRN
  Administered 2022-04-30: 100 mg via INTRAVENOUS

## 2022-04-30 MED ORDER — IOHEXOL 180 MG/ML  SOLN
INTRAMUSCULAR | Status: DC | PRN
  Administered 2022-04-30: 10 mL
  Administered 2022-04-30: 20 mL

## 2022-04-30 MED ORDER — FENTANYL CITRATE (PF) 100 MCG/2ML IJ SOLN
INTRAMUSCULAR | Status: AC
  Filled 2022-04-30: qty 2

## 2022-04-30 MED ORDER — ONDANSETRON HCL 4 MG/2ML IJ SOLN
INTRAMUSCULAR | Status: DC | PRN
  Administered 2022-04-30: 4 mg via INTRAVENOUS

## 2022-04-30 MED ORDER — CHLORHEXIDINE GLUCONATE 0.12 % MT SOLN
OROMUCOSAL | Status: AC
  Administered 2022-04-30: 15 mL via OROMUCOSAL
  Filled 2022-04-30: qty 15

## 2022-04-30 MED ORDER — OXYCODONE HCL 5 MG PO TABS: 5.0000 mg | ORAL_TABLET | Freq: Once | ORAL | Status: DC | PRN

## 2022-04-30 MED ORDER — LACTATED RINGERS IV SOLN: INTRAVENOUS | Status: DC

## 2022-04-30 SURGICAL SUPPLY — 22 items
BAG DRAIN SIEMENS DORNER NS (MISCELLANEOUS) ×2 IMPLANT
BAG DRN NS LF (MISCELLANEOUS) ×2
BRUSH SCRUB EZ  4% CHG (MISCELLANEOUS) ×2
BRUSH SCRUB EZ 1% IODOPHOR (MISCELLANEOUS) ×2 IMPLANT
BRUSH SCRUB EZ 4% CHG (MISCELLANEOUS) ×2 IMPLANT
CATH URETL OPEN 5X70 (CATHETERS) ×2 IMPLANT
DRAPE UTILITY 15X26 TOWEL STRL (DRAPES) ×2 IMPLANT
DRSG TELFA 3X4 N-ADH STERILE (GAUZE/BANDAGES/DRESSINGS) ×2 IMPLANT
ELECT REM PT RETURN 9FT ADLT (ELECTROSURGICAL) ×2
ELECTRODE REM PT RTRN 9FT ADLT (ELECTROSURGICAL) ×2 IMPLANT
GAUZE 4X4 16PLY ~~LOC~~+RFID DBL (SPONGE) ×4 IMPLANT
GLOVE BIO SURGEON STRL SZ 6.5 (GLOVE) ×2 IMPLANT
GOWN STRL REUS W/ TWL LRG LVL3 (GOWN DISPOSABLE) ×4 IMPLANT
GOWN STRL REUS W/TWL LRG LVL3 (GOWN DISPOSABLE) ×4
GUIDEWIRE STR DUAL SENSOR (WIRE) ×2 IMPLANT
KIT TURNOVER CYSTO (KITS) ×2 IMPLANT
NDL SAFETY ECLIP 18X1.5 (MISCELLANEOUS) ×2 IMPLANT
PACK CYSTO AR (MISCELLANEOUS) ×2 IMPLANT
SET CYSTO W/LG BORE CLAMP LF (SET/KITS/TRAYS/PACK) ×2 IMPLANT
SURGILUBE 2OZ TUBE FLIPTOP (MISCELLANEOUS) ×2 IMPLANT
WATER STERILE IRR 3000ML UROMA (IV SOLUTION) ×2 IMPLANT
WATER STERILE IRR 500ML POUR (IV SOLUTION) ×2 IMPLANT

## 2022-04-30 NOTE — Interval H&P Note (Signed)
History and Physical Interval Note:  04/30/2022 10:06 AM  Chelsea Burke  has presented today for surgery, with the diagnosis of Bladder Lesion.  The various methods of treatment have been discussed with the patient and family. After consideration of risks, benefits and other options for treatment, the patient has consented to  Procedure(s): CYSTOSCOPY WITH BLADDER BIOPSY (N/A) CYSTOSCOPY WITH RETROGRADE PYELOGRAM (Bilateral) as a surgical intervention.  The patient's history has been reviewed, patient examined, no change in status, stable for surgery.  I have reviewed the patient's chart and labs.  Questions were answered to the patient's satisfaction.    RRR CTAB   Hollice Espy

## 2022-04-30 NOTE — Anesthesia Preprocedure Evaluation (Addendum)
Anesthesia Evaluation  Patient identified by MRN, date of birth, ID band Patient awake    Reviewed: Allergy & Precautions, H&P , NPO status , Patient's Chart, lab work & pertinent test results, reviewed documented beta blocker date and time   Airway Mallampati: III   Neck ROM: full    Dental  (+) Edentulous Lower, Edentulous Upper, Dental Advidsory Given   Pulmonary neg pulmonary ROS, shortness of breath and with exertion, asthma    Pulmonary exam normal        Cardiovascular Exercise Tolerance: Poor hypertension, On Medications (-) angina (-) Past MI and (-) CABG Normal cardiovascular exam Rhythm:regular Rate:Normal     Neuro/Psych  PSYCHIATRIC DISORDERS  Depression    negative neurological ROS  negative psych ROS   GI/Hepatic negative GI ROS, Neg liver ROS, hiatal hernia,GERD  Medicated,,  Endo/Other  negative endocrine ROS  Morbid obesity  Renal/GU negative Renal ROS  negative genitourinary   Musculoskeletal   Abdominal   Peds  Hematology negative hematology ROS (+)   Anesthesia Other Findings Past Medical History: No date: Arthritis No date: Asthma No date: Depression No date: Dyspnea No date: Fatty liver No date: GERD (gastroesophageal reflux disease) No date: History of hiatal hernia No date: History of kidney stones No date: Hyperlipidemia No date: Hypertension Past Surgical History: No date: ABDOMINAL HYSTERECTOMY 03/2006: BREAST BIOPSY; Bilateral     Comment:  bil bx/clips-neg No date: CHOLECYSTECTOMY No date: NASAL SINUS SURGERY No date: NISSEN FUNDOPLICATION No date: TOTAL KNEE ARTHROPLASTY   Reproductive/Obstetrics negative OB ROS                             Anesthesia Physical Anesthesia Plan  ASA: 3  Anesthesia Plan: General   Post-op Pain Management:    Induction: Intravenous  PONV Risk Score and Plan: 3  Airway Management Planned: Oral  ETT  Additional Equipment:   Intra-op Plan:   Post-operative Plan: Extubation in OR  Informed Consent: I have reviewed the patients History and Physical, chart, labs and discussed the procedure including the risks, benefits and alternatives for the proposed anesthesia with the patient or authorized representative who has indicated his/her understanding and acceptance.     Dental Advisory Given  Plan Discussed with: CRNA  Anesthesia Plan Comments: (Patient consented for risks of anesthesia including but not limited to:  - adverse reactions to medications - damage to eyes, teeth, lips or other oral mucosa - nerve damage due to positioning  - sore throat or hoarseness - Damage to heart, brain, nerves, lungs, other parts of body or loss of life  Patient voiced understanding.)        Anesthesia Quick Evaluation

## 2022-04-30 NOTE — Anesthesia Postprocedure Evaluation (Signed)
Anesthesia Post Note  Patient: [name]  Procedure(s) Performed: CYSTOSCOPY WITH BLADDER BIOPSY (Bladder) CYSTOSCOPY WITH RETROGRADE PYELOGRAM (Bilateral)  Patient location during evaluation: PACU Anesthesia Type: General Level of consciousness: awake and alert Pain management: pain level controlled Vital Signs Assessment: post-procedure vital signs reviewed and stable Respiratory status: spontaneous breathing, nonlabored ventilation, respiratory function stable and patient connected to nasal cannula oxygen Cardiovascular status: blood pressure returned to baseline and stable Postop Assessment: no apparent nausea or vomiting Anesthetic complications: no  No notable events documented.   Last Vitals:  Vitals:   04/30/22 1105 04/30/22 1115  BP:  (!) 174/85  Pulse:  74  Resp:  20  Temp:    SpO2: 93% 96%    Last Pain:  Vitals:   04/30/22 1057  PainSc: 0-No pain                 Dimas Millin

## 2022-04-30 NOTE — Anesthesia Procedure Notes (Signed)
Procedure Name: Intubation Date/Time: 04/30/2022 10:30 AM  Performed by: Chanetta Marshall, CRNAPre-anesthesia Checklist: Patient identified, Emergency Drugs available, Suction available and Patient being monitored Patient Re-evaluated:Patient Re-evaluated prior to induction Oxygen Delivery Method: Circle system utilized Preoxygenation: Pre-oxygenation with 100% oxygen Induction Type: IV induction Ventilation: Mask ventilation without difficulty Laryngoscope Size: McGraph and 3 Grade View: Grade I Tube type: Oral Tube size: 7.0 mm Number of attempts: 1 Airway Equipment and Method: Oral airway and Video-laryngoscopy Placement Confirmation: ETT inserted through vocal cords under direct vision, positive ETCO2, breath sounds checked- equal and bilateral and CO2 detector Secured at: 21 cm Tube secured with: Tape Dental Injury: Teeth and Oropharynx as per pre-operative assessment

## 2022-04-30 NOTE — Op Note (Signed)
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  Whitish pedunculated lesion measuring about 5 mm just beyond right UO.  Mild/ moderate bladder trabeculation.  EBL: minimal   Specimens: bladder biopsy  Drains: none  Indication: Chelsea Burke is a 77 y.o. patient with with recurrent UTIs/.  After reviewing the management options for treatment, she elected to proceed with the above surgical procedure(s). We have discussed the potential benefits and risks of the procedure, side effects of the proposed treatment, the likelihood of the patient achieving the goals of the procedure, and any potential problems that might occur during the procedure or recuperation. Informed consent has been obtained.  Description of procedure:  The patient was taken to the operating room and general anesthesia was induced.  The patient was placed in the dorsal lithotomy position, prepped and draped in the usual sterile fashion, and preoperative antibiotics were administered. A preoperative time-out was performed.   21 [place] scope was advanced per urethra into the bladder.  Notably the bladder was mild to moderately trabeculated.  There was a lesion just beyond the right UO, approximately 5 mm which had pedunculated and raised type appearance.  Did not appear to be hypervascular.  It was suspicious for an early neoplasm with a somewhat low-grade appearance.  There were no other lesions within the bladder.  Attention was turned to the left UO which was cannulated using an open-ended  ureteral catheter.  A gentle retrograde pyelogram on the side did show some pelviectasis and a somewhat of a low-grade UPJ type appearance with an abrupt transition between the renal pelvis and the proximal ureter along with some dilation in the lower pole calyx.  This was also appreciated on renal ultrasound previously.  There were no filling defects or hydroureteronephrosis.  Attention was then turned to the right side.  The same procedure was performed.  This retrograde was unremarkable without hydroureteronephrosis or filling defects.  Next, cold cup biopsy forceps were used to completely resect the area in question and 1 bite.  This was passed off the field.  Bugbee electrocautery was used to fulgurate the base of this for excellent hemostasis.  The bladder was then drained, the patient was cleaned and dried, repositioned in the supine position, reversed of anesthesia, and taken the PACU in stable condition.  Plan: I will call the patient with her biopsy results.  Follow-up to be determined based on these results.   Hollice Espy, M.D.

## 2022-04-30 NOTE — Discharge Instructions (Addendum)
Transurethral Resection of Bladder Tumor (TURBT) or Bladder Biopsy   Definition:  Transurethral Resection of the Bladder Tumor is a surgical procedure used to diagnose and remove tumors within the bladder. TURBT is the most common treatment for early stage bladder cancer.  General instructions:     Your recent bladder surgery requires very little post hospital care but some definite precautions.  Despite the fact that no skin incisions were used, the area around the bladder incisions are raw and covered with scabs to promote healing and prevent bleeding. Certain precautions are needed to insure that the scabs are not disturbed over the next 2-4 weeks while the healing proceeds.  Because the raw surface inside your bladder and the irritating effects of urine you may expect frequency of urination and/or urgency (a stronger desire to urinate) and perhaps even getting up at night more often. This will usually resolve or improve slowly over the healing period. You may see some blood in your urine over the first 6 weeks. Do not be alarmed, even if the urine was clear for a while. Get off your feet and drink lots of fluids until clearing occurs. If you start to pass clots or don't improve call us.  Diet:  You may return to your normal diet immediately. Because of the raw surface of your bladder, alcohol, spicy foods, foods high in acid and drinks with caffeine may cause irritation or frequency and should be used in moderation. To keep your urine flowing freely and avoid constipation, drink plenty of fluids during the day (8-10 glasses). Tip: Avoid cranberry juice because it is very acidic.  Activity:  Your physical activity doesn't need to be restricted. However, if you are very active, you may see some blood in the urine. We suggest that you reduce your activity under the circumstances until the bleeding has stopped.  Bowels:  It is important to keep your bowels regular during the postoperative  period. Straining with bowel movements can cause bleeding. A bowel movement every other day is reasonable. Use a mild laxative if needed, such as milk of magnesia 2-3 tablespoons, or 2 Dulcolax tablets. Call if you continue to have problems. If you had been taking narcotics for pain, before, during or after your surgery, you may be constipated. Take a laxative if necessary.    Medication:  You should resume your pre-surgery medications unless told not to. In addition you may be given an antibiotic to prevent or treat infection. Antibiotics are not always necessary. All medication should be taken as prescribed until the bottles are finished unless you are having an unusual reaction to one of the drugs.   [place], [place] [address] [phone]   AMBULATORY SURGERY  DISCHARGE INSTRUCTIONS   The drugs that you were given will stay in your system until tomorrow so for the next 24 hours you should not:  Drive an automobile Make any legal decisions Drink any alcoholic beverage   You may resume regular meals tomorrow.  Today it is better to start with liquids and gradually work up to solid foods.  You may eat anything you prefer, but it is better to start with liquids, then soup and crackers, and gradually work up to solid foods.   Please notify your doctor immediately if you have any unusual bleeding, trouble breathing, redness and pain at the surgery site, drainage, fever, or pain not relieved by medication.     Additional Instructions:

## 2022-04-30 NOTE — Transfer of Care (Signed)
Immediate Anesthesia Transfer of Care Note  Patient: Chelsea Burke  Procedure(s) Performed: CYSTOSCOPY WITH BLADDER BIOPSY (Bladder) CYSTOSCOPY WITH RETROGRADE PYELOGRAM (Bilateral)  Patient Location: PACU  Anesthesia Type:General  Level of Consciousness: awake, alert , and oriented  Airway & Oxygen Therapy: Patient Spontanous Breathing  Post-op Assessment: Report given to RN and Post -op Vital signs reviewed and stable  Post vital signs: Reviewed and stable  Last Vitals:  Vitals Value Taken Time  BP 166/84 04/30/22 1100  Temp    Pulse 75 04/30/22 1103  Resp 15 04/30/22 1103  SpO2 94 % 04/30/22 1103  Vitals shown include unvalidated device data.  Last Pain:  Vitals:   04/30/22 0939  PainSc: 0-No pain         Complications: No notable events documented.

## 2022-05-01 LAB — SURGICAL PATHOLOGY

## 2022-07-15 ENCOUNTER — Encounter: Payer: Self-pay | Admitting: Urology

## 2022-07-16 ENCOUNTER — Encounter: Payer: Self-pay | Admitting: Urology

## 2022-07-19 ENCOUNTER — Other Ambulatory Visit: Payer: Self-pay | Admitting: Family Medicine

## 2022-07-19 DIAGNOSIS — F3341 Major depressive disorder, recurrent, in partial remission: Secondary | ICD-10-CM | POA: Diagnosis not present

## 2022-07-19 DIAGNOSIS — R7302 Impaired glucose tolerance (oral): Secondary | ICD-10-CM | POA: Diagnosis not present

## 2022-07-19 DIAGNOSIS — I10 Essential (primary) hypertension: Secondary | ICD-10-CM | POA: Diagnosis not present

## 2022-07-19 DIAGNOSIS — I700 Atherosclerosis of aorta: Secondary | ICD-10-CM | POA: Diagnosis not present

## 2022-07-19 DIAGNOSIS — K219 Gastro-esophageal reflux disease without esophagitis: Secondary | ICD-10-CM | POA: Diagnosis not present

## 2022-07-19 DIAGNOSIS — Z1231 Encounter for screening mammogram for malignant neoplasm of breast: Secondary | ICD-10-CM

## 2022-07-19 DIAGNOSIS — Z0000 Encounter for general adult medical examination without abnormal findings: Secondary | ICD-10-CM | POA: Diagnosis not present

## 2022-07-19 DIAGNOSIS — J4530 Mild persistent asthma, uncomplicated: Secondary | ICD-10-CM | POA: Diagnosis not present

## 2022-07-19 DIAGNOSIS — E782 Mixed hyperlipidemia: Secondary | ICD-10-CM | POA: Diagnosis not present

## 2022-07-25 ENCOUNTER — Other Ambulatory Visit: Payer: Medicare PPO | Admitting: Urology

## 2022-07-27 ENCOUNTER — Other Ambulatory Visit: Payer: Medicare PPO | Admitting: Urology

## 2022-08-17 ENCOUNTER — Other Ambulatory Visit: Payer: Self-pay

## 2022-08-17 ENCOUNTER — Encounter: Payer: Self-pay | Admitting: Urology

## 2022-08-17 ENCOUNTER — Other Ambulatory Visit
Admission: RE | Admit: 2022-08-17 | Discharge: 2022-08-17 | Disposition: A | Discharge status: Home / Self Care | Payer: Medicare PPO | Source: Ambulatory Visit | Attending: Urology | Admitting: Urology

## 2022-08-17 ENCOUNTER — Ambulatory Visit: Payer: Medicare PPO | Admitting: Urology

## 2022-08-17 VITALS — BP 116/67 | HR 69 | Ht 65.0 in | Wt 264.4 lb

## 2022-08-17 DIAGNOSIS — N329 Bladder disorder, unspecified: Secondary | ICD-10-CM | POA: Diagnosis not present

## 2022-08-17 DIAGNOSIS — Z8551 Personal history of malignant neoplasm of bladder: Secondary | ICD-10-CM | POA: Diagnosis not present

## 2022-08-17 DIAGNOSIS — Z08 Encounter for follow-up examination after completed treatment for malignant neoplasm: Secondary | ICD-10-CM | POA: Diagnosis not present

## 2022-08-17 LAB — URINALYSIS, COMPLETE (UACMP) WITH MICROSCOPIC
Bilirubin Urine: NEGATIVE
Glucose, UA: NEGATIVE mg/dL
Nitrite: NEGATIVE
Protein, ur: NEGATIVE mg/dL
Specific Gravity, Urine: 1.025 (ref 1.005–1.030)
Squamous Epithelial / HPF: NONE SEEN /HPF (ref 0–5)
pH: 5.5 (ref 5.0–8.0)

## 2022-08-17 NOTE — Progress Notes (Signed)
   08/17/22  CC:  Chief Complaint  Patient presents with   Cysto    HPI: 77 year old female with recurrent UTIs, microscopic hematuria found to have a bladder lesion who presents today for cystoscopy.  She was taken to the operating room on 04/30/2022 at which time a lesion on the right lateral bladder wall was biopsied just adjacent to her UO.  This was consistent with low-grade superficial TCC.  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  NED. A&Ox3.   No respiratory distress   Abd soft, NT, ND Normal external genitalia with patent urethral meatus  Cystoscopy Procedure Note  Patient identification was confirmed, informed consent was obtained, and patient was prepped using Betadine solution.  Lidocaine jelly was administered per urethral meatus.    Procedure: - Flexible cystoscope introduced, without any difficulty.   - Thorough search of the bladder revealed:    normal urethral meatus    normal urothelium with an area of erythema at the previous biopsy site of right lateral bladder wall with some slight texture eyes change    no stones    no ulcers     no tumors    no urethral polyps    no trabeculation  - Ureteral orifices were normal in position and appearance.  Post-Procedure: - Patient tolerated the procedure well  Assessment/ Plan:  1. History of bladder cancer Recently diagnosed low risk bladder cancer status post biopsy/resection  Cystoscopy today with likely irritation at the biopsy site without obvious recurrence although prefer to continue to monitor this area fairly closely  Plan for cystoscopy in 3 months, if the area is stable or improved, will transition to every 6 months - Urinalysis, Complete w Microscopic (For [facility] ONLY); Future    Return in about 3 months (around 11/17/2022) for cysto.  Vanna Scotland, MD

## 2022-09-11 ENCOUNTER — Ambulatory Visit
Admission: RE | Admit: 2022-09-11 | Discharge: 2022-09-11 | Disposition: A | Discharge status: Home / Self Care | Payer: Medicare PPO | Source: Ambulatory Visit | Attending: Family Medicine | Admitting: Family Medicine

## 2022-09-11 DIAGNOSIS — Z1231 Encounter for screening mammogram for malignant neoplasm of breast: Secondary | ICD-10-CM | POA: Insufficient documentation

## 2022-11-23 ENCOUNTER — Ambulatory Visit: Payer: Medicare PPO | Admitting: Urology

## 2022-11-23 ENCOUNTER — Other Ambulatory Visit
Admission: RE | Admit: 2022-11-23 | Discharge: 2022-11-23 | Disposition: A | Discharge status: Home / Self Care | Payer: Medicare PPO | Attending: Urology | Admitting: Urology

## 2022-11-23 ENCOUNTER — Other Ambulatory Visit: Payer: Self-pay

## 2022-11-23 VITALS — BP 147/78 | HR 76 | Ht 65.0 in | Wt 258.1 lb

## 2022-11-23 DIAGNOSIS — Z08 Encounter for follow-up examination after completed treatment for malignant neoplasm: Secondary | ICD-10-CM

## 2022-11-23 DIAGNOSIS — Z8551 Personal history of malignant neoplasm of bladder: Secondary | ICD-10-CM

## 2022-11-23 DIAGNOSIS — R3129 Other microscopic hematuria: Secondary | ICD-10-CM

## 2022-11-23 LAB — URINALYSIS, COMPLETE (UACMP) WITH MICROSCOPIC
Glucose, UA: NEGATIVE mg/dL
Hgb urine dipstick: NEGATIVE
Nitrite: NEGATIVE
Protein, ur: NEGATIVE mg/dL
Specific Gravity, Urine: 1.025 (ref 1.005–1.030)
pH: 5.5 (ref 5.0–8.0)

## 2022-11-23 NOTE — Progress Notes (Signed)
   11/23/22  CC:  Chief Complaint  Patient presents with   Cysto    HPI: 77 year old female with recurrent UTIs, microscopic hematuria found to have a bladder lesion who presents today for cystoscopy.  She was taken to the operating room on 04/30/2022 at which time a lesion on the right lateral bladder wall was biopsied just adjacent to her UO.  This was consistent with low-grade superficial TCC.  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  NED. A&Ox3.   No respiratory distress   Abd soft, NT, ND Normal external genitalia with patent urethral meatus  Cystoscopy Procedure Note  Patient identification was confirmed, informed consent was obtained, and patient was prepped using Betadine solution.  Lidocaine jelly was administered per urethral meatus.    Procedure: - Flexible cystoscope introduced, without any difficulty.   - Thorough search of the bladder revealed:    normal urethral meatus    normal urothelium with stellate scar beyond the right UO.  Some mild diffuse trigonitis bilaterally.    no stones    no ulcers     no tumors    no urethral polyps    no trabeculation  - Ureteral orifices were normal in position and appearance.  Post-Procedure: - Patient tolerated the procedure well  Assessment/ Plan:  1. History of bladder cancer NED  Plan to transition to every 6 month cystoscopy    Vanna Scotland, MD

## 2023-01-29 DIAGNOSIS — Z1331 Encounter for screening for depression: Secondary | ICD-10-CM | POA: Diagnosis not present

## 2023-01-29 DIAGNOSIS — E782 Mixed hyperlipidemia: Secondary | ICD-10-CM | POA: Diagnosis not present

## 2023-01-29 DIAGNOSIS — J4530 Mild persistent asthma, uncomplicated: Secondary | ICD-10-CM | POA: Diagnosis not present

## 2023-01-29 DIAGNOSIS — K219 Gastro-esophageal reflux disease without esophagitis: Secondary | ICD-10-CM | POA: Diagnosis not present

## 2023-01-29 DIAGNOSIS — I700 Atherosclerosis of aorta: Secondary | ICD-10-CM | POA: Diagnosis not present

## 2023-01-29 DIAGNOSIS — R7302 Impaired glucose tolerance (oral): Secondary | ICD-10-CM | POA: Diagnosis not present

## 2023-01-29 DIAGNOSIS — I10 Essential (primary) hypertension: Secondary | ICD-10-CM | POA: Diagnosis not present

## 2023-01-29 DIAGNOSIS — F3341 Major depressive disorder, recurrent, in partial remission: Secondary | ICD-10-CM | POA: Diagnosis not present

## 2023-02-12 NOTE — Progress Notes (Unsigned)
02/18/2023 10:21 AM   Chelsea Burke [birth year] [id number]  Referring provider: Marina Goodell, MD [address] [place],  [place] [address]  Urological history: 1. Nephrolithiasis -stone composition unknown -KUB (03/2022) Bilateral nephrolithiasis.    2. OAB -Contributing factors of age, vaginal atrophy, obesity, hypertension, arthritis, depression, diuretics and asthma -Gemtesa 75 mg daily   3. Incontinence -both urge/stress -Contributing factors of age, vaginal atrophy, obesity, hypertension, arthritis, depression, diuretics and asthma -Gemtesa 75 mg daily  4. Bladder cancer -low-grade superficial TCC -Surveillance cystoscopy (11/2022) -no recurrence -Next event cystoscopy scheduled for March 2025  5. Low-grade left UPJ obstruction -Fullness of left renal pelvis along with dilation of the left lower pole calyx on retrograde pyelogram (04/2022)   No chief complaint on file.  HPI: Chelsea Burke is a 77 y.o. female who presents today for yearly follow up.    Previous records reviewed.   PVR ***  KUB ***  PMH: Past Medical History:  Diagnosis Date   Arthritis    Asthma    Atherosclerosis of abdominal aorta (HCC)    Benign essential hypertension    Cataract cortical, senile    Depression    Dyspnea    Fatty liver    GERD (gastroesophageal reflux disease)    History of hiatal hernia    History of kidney stones    Hyperlipidemia    Lesion of bladder 03/2022   Mixed hyperlipidemia    Morbid obesity with BMI of 40.0-44.9, adult [facility])    Pneumonia     Surgical History: Past Surgical History:  Procedure Laterality Date   BREAST BIOPSY Bilateral 03/2006   bil bx/clips-neg   CARPAL TUNNEL RELEASE Right    CHOLECYSTECTOMY     COLONOSCOPY     2002, 2007, 2014   COLONOSCOPY WITH PROPOFOL N/A 07/19/2017   Procedure: COLONOSCOPY WITH PROPOFOL;  Surgeon: Scot Jun, MD;  Location: [facility] ENDOSCOPY;  Service: Endoscopy;  Laterality: N/A;    CYSTOSCOPY W/ RETROGRADES Bilateral 04/30/2022   Procedure: CYSTOSCOPY WITH RETROGRADE PYELOGRAM;  Surgeon: Vanna Scotland, MD;  Location: [facility] ORS;  Service: Urology;  Laterality: Bilateral;   CYSTOSCOPY WITH BIOPSY N/A 04/30/2022   Procedure: CYSTOSCOPY WITH BLADDER BIOPSY;  Surgeon: Vanna Scotland, MD;  Location: [facility] ORS;  Service: Urology;  Laterality: N/A;   ESOPHAGOGASTRODUODENOSCOPY     1990, 1993, 2014   ESOPHAGOGASTRODUODENOSCOPY (EGD) WITH PROPOFOL N/A 07/19/2017   Procedure: ESOPHAGOGASTRODUODENOSCOPY (EGD) WITH PROPOFOL;  Surgeon: Scot Jun, MD;  Location: [facility] ENDOSCOPY;  Service: Endoscopy;  Laterality: N/A;   KNEE ARTHROSCOPY Right    NASAL SINUS SURGERY     NISSEN FUNDOPLICATION  10/10/2018   TOTAL KNEE ARTHROPLASTY Bilateral    VAGINAL HYSTERECTOMY  1970    Home Medications:  Allergies as of 02/18/2023       Reactions   Doxycycline Anaphylaxis   Throat swelling   Keflex [cephalexin] Rash   Norvasc [amlodipine] Rash   Penicillins Rash   Sulfa Antibiotics Rash        Medication List        Accurate as of February 12, 2023 10:21 AM. If you have any questions, ask your nurse or doctor.          acetaminophen 650 MG CR tablet Commonly known as: TYLENOL Take 650 mg by mouth every 8 (eight) hours as needed for pain.   albuterol 108 (90 Base) MCG/ACT inhaler Commonly known as: VENTOLIN HFA Inhale 2 puffs into the lungs every 6 (six)  hours as needed.   ascorbic acid 500 MG tablet Commonly known as: VITAMIN C Take 500 mg by mouth daily.   atorvastatin 20 MG tablet Commonly known as: LIPITOR Take 20 mg by mouth at bedtime.   DULoxetine 60 MG capsule Commonly known as: CYMBALTA Take 60 mg by mouth daily.   Flaxseed Oil 1000 MG Caps Take 1 capsule by mouth daily.   Lutein 20 MG Caps Take 20 mg by mouth daily.   spironolactone 25 MG tablet Commonly known as: ALDACTONE Take 25 mg by mouth daily.   telmisartan 80 MG tablet Commonly known  as: MICARDIS Take 80 mg by mouth at bedtime.   traZODone 50 MG tablet Commonly known as: DESYREL Take 50 mg by mouth at bedtime.   Vitamin D3 50 MCG (2000 UT) capsule Take 2,000 Units by mouth daily.   vitamin E 180 MG (400 UNITS) capsule Take 400 Units by mouth daily.   Wixela Inhub 250-50 MCG/ACT Aepb Generic drug: fluticasone-salmeterol Inhale 1 puff into the lungs 2 (two) times daily.        Allergies:  Allergies  Allergen Reactions   Doxycycline Anaphylaxis    Throat swelling   Keflex [Cephalexin] Rash   Norvasc [Amlodipine] Rash   Penicillins Rash   Sulfa Antibiotics Rash    Family History: No family history on file.  Social History:  reports that she has never smoked. She has never been exposed to tobacco smoke. She has never used smokeless tobacco. She reports that she does not drink alcohol and does not use drugs.  ROS: Pertinent ROS in HPI  Physical Exam: There were no vitals taken for this visit.  Constitutional:  Well nourished. Alert and oriented, No acute distress. HEENT: [place] AT, moist mucus membranes.  Trachea midline, no masses. Cardiovascular: No clubbing, cyanosis, or edema. Respiratory: Normal respiratory effort, no increased work of breathing. GU: No CVA tenderness.  No bladder fullness or masses.  Recession of labia minora, dry, pale vulvar vaginal mucosa and loss of mucosal ridges and folds.  Normal urethral meatus, no lesions, no prolapse, no discharge.   No urethral masses, tenderness and/or tenderness. No bladder fullness, tenderness or masses. *** vagina mucosa, *** estrogen effect, no discharge, no lesions, *** pelvic support, *** cystocele and *** rectocele noted.  No cervical motion tenderness.  Uterus is freely mobile and non-fixed.  No adnexal/parametria masses or tenderness noted.  Anus and perineum are without rashes or lesions.   ***  Neurologic: Grossly intact, no focal deficits, moving all 4 extremities. Psychiatric: Normal mood and  affect.    Laboratory Data: Comprehensive Metabolic Panel (CMP) Order: [id number] Component Ref Range & Units 2 wk ago  Glucose 70 - 110 mg/dL 98  Sodium 191 - 478 mmol/L 142  Potassium 3.6 - 5.1 mmol/L 5  Chloride 97 - 109 mmol/L 106  Carbon Dioxide (CO2) 22.0 - 32.0 mmol/L 25.9  Urea Nitrogen (BUN) 7 - 25 mg/dL 20  Creatinine 0.6 - 1.1 mg/dL 0.8  Glomerular Filtration Rate (eGFR) >60 mL/min/1.73sq m 76  Comment: CKD-EPI (2021) does not include patient's race in the calculation of eGFR.  Monitoring changes of plasma creatinine and eGFR over time is useful for monitoring kidney function.  Interpretive Ranges for eGFR (CKD-EPI 2021):  eGFR:       >60 mL/min/1.73 sq. m - Normal eGFR:       30-59 mL/min/1.73 sq. m - Moderately Decreased eGFR:       15-29 mL/min/1.73 sq. m  - Severely  Decreased eGFR:       < 15 mL/min/1.73 sq. m  - Kidney Failure   Note: These eGFR calculations do not apply in acute situations when eGFR is changing rapidly or patients on dialysis.  Calcium 8.7 - 10.3 mg/dL 9.9  AST 8 - 39 U/L 20  ALT 5 - 38 U/L 20  Alk Phos (alkaline Phosphatase) 34 - 104 U/L 69  Albumin 3.5 - 4.8 g/dL 4.4  Bilirubin, Total 0.3 - 1.2 mg/dL 0.7  Protein, Total 6.1 - 7.9 g/dL 6.7  A/G Ratio 1.0 - 5.0 gm/dL 1.9  Resulting Agency [facility]   Specimen Collected: 01/29/23 09:58   Performed by: [facility] Last Resulted: 01/29/23 17:46  Received From: [facility]  Result Received: 02/12/23 10:21   Lipid Panel w/calc LDL Order: [id number] Component Ref Range & Units 2 wk ago  Cholesterol, Total 100 - 200 mg/dL 865  Triglyceride 35 - 199 mg/dL 784  HDL (High Density Lipoprotein) Cholesterol 35.0 - 85.0 mg/dL 69.6  LDL Calculated 0 - 130 mg/dL 81  VLDL Cholesterol mg/dL 22  Cholesterol/HDL Ratio 2.8  Resulting Agency [facility]   Specimen Collected: 01/29/23 09:58   Performed by: [facility]  [facility] Last Resulted: 01/29/23 17:46  Received From: [facility]  Result Received: 02/12/23 10:21   Hemoglobin A1C Order: [id number] Component Ref Range & Units 2 wk ago  Hemoglobin A1C 4.2 - 5.6 % 5.3  Average Blood Glucose (Calc) mg/dL 440  Resulting Agency [facility]  Narrative Performed by [facility] Normal Range:    4.2 - 5.6% Increased Risk:  5.7 - 6.4% Diabetes:        >= 6.5% Glycemic Control for adults with diabetes:  <7%    Specimen Collected: 01/29/23 09:58   Performed by: [facility] Last Resulted: 01/29/23 12:29  Received From: [facility]  Result Received: 02/12/23 10:21  I have reviewed the labs.   Pertinent Imaging: ***  Assessment & Plan:    1. OAB -PVR demonstrates adequate emptying  ***  2. Incontinence -***  3. Nephrolithiasis - KUB ***  4. Bladder cancer -Scheduled for surveillance cystoscopy in March 2025  No follow-ups on file.  These notes generated with voice recognition software. I apologize for typographical errors.  Cloretta Ned  [facility] [address]  [address] [place], [place] [address] [phone]

## 2023-02-18 ENCOUNTER — Ambulatory Visit: Payer: Medicare PPO | Admitting: Urology

## 2023-02-18 DIAGNOSIS — N200 Calculus of kidney: Secondary | ICD-10-CM

## 2023-02-18 DIAGNOSIS — N3281 Overactive bladder: Secondary | ICD-10-CM

## 2023-02-18 DIAGNOSIS — N3946 Mixed incontinence: Secondary | ICD-10-CM

## 2023-02-18 DIAGNOSIS — C679 Malignant neoplasm of bladder, unspecified: Secondary | ICD-10-CM

## 2023-03-14 ENCOUNTER — Encounter: Payer: Self-pay | Admitting: Urology

## 2023-04-01 ENCOUNTER — Ambulatory Visit: Payer: Medicare PPO | Admitting: Urology

## 2023-05-24 ENCOUNTER — Other Ambulatory Visit: Payer: Medicare PPO | Admitting: Urology

## 2023-05-29 ENCOUNTER — Other Ambulatory Visit: Payer: Self-pay

## 2023-05-29 DIAGNOSIS — R350 Frequency of micturition: Secondary | ICD-10-CM

## 2023-05-29 DIAGNOSIS — Z8551 Personal history of malignant neoplasm of bladder: Secondary | ICD-10-CM

## 2023-05-31 ENCOUNTER — Ambulatory Visit: Payer: Medicare PPO | Admitting: Urology

## 2023-05-31 ENCOUNTER — Encounter: Payer: Self-pay | Admitting: Urology

## 2023-05-31 ENCOUNTER — Other Ambulatory Visit
Admission: RE | Admit: 2023-05-31 | Discharge: 2023-05-31 | Disposition: A | Discharge status: Home / Self Care | Attending: Urology | Admitting: Urology

## 2023-05-31 VITALS — BP 134/70 | HR 84

## 2023-05-31 DIAGNOSIS — N3289 Other specified disorders of bladder: Secondary | ICD-10-CM | POA: Diagnosis not present

## 2023-05-31 DIAGNOSIS — Z8551 Personal history of malignant neoplasm of bladder: Secondary | ICD-10-CM

## 2023-05-31 DIAGNOSIS — R350 Frequency of micturition: Secondary | ICD-10-CM | POA: Diagnosis not present

## 2023-05-31 LAB — URINALYSIS, COMPLETE (UACMP) WITH MICROSCOPIC
Glucose, UA: NEGATIVE mg/dL
Ketones, ur: NEGATIVE mg/dL
Nitrite: NEGATIVE
Protein, ur: 30 mg/dL — AB
Specific Gravity, Urine: >1.03 — ABNORMAL HIGH (ref 1.005–1.030)
pH: 5.5 (ref 5.0–8.0)

## 2023-05-31 MED ORDER — LIDOCAINE HCL URETHRAL/MUCOSAL 2 % EX GEL
1.0000 | Freq: Once | CUTANEOUS | Status: AC
  Administered 2023-05-31: 1 via URETHRAL

## 2023-05-31 NOTE — Progress Notes (Signed)
   05/31/23  CC:  Chief Complaint  Patient presents with   Cysto    HPI: 78 year old female with recurrent UTIs, microscopic hematuria found to have a bladder lesion who presents today for cystoscopy.  She was taken to the operating room on 04/30/2022 at which time a lesion on the right lateral bladder wall was biopsied just adjacent to her UO.  This was consistent with low-grade superficial TCC.  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  NED. A&Ox3.   No respiratory distress   Abd soft, NT, ND Normal external genitalia with patent urethral meatus  Cystoscopy Procedure Note  Patient identification was confirmed, informed consent was obtained, and patient was prepped using Betadine solution.  Lidocaine jelly was administered per urethral meatus.    Procedure: - Flexible cystoscope introduced, without any difficulty.   - Thorough search of the bladder revealed:    normal urethral meatus    normal urothelium with stellate scar beyond the right UO.  Stable mild diffuse trigonitis bilaterally.    no stones    no ulcers     no tumors    no urethral polyps    no trabeculation  - Ureteral orifices were normal in position and appearance.  Post-Procedure: - Patient tolerated the procedure well  Assessment/ Plan:  1. History of bladder cancer NED  Will transition to annual cysto  given low risk and lack of recurrence    Vanna Scotland, MD

## 2023-06-25 DIAGNOSIS — I4510 Unspecified right bundle-branch block: Secondary | ICD-10-CM | POA: Diagnosis not present

## 2023-06-25 DIAGNOSIS — E782 Mixed hyperlipidemia: Secondary | ICD-10-CM | POA: Diagnosis not present

## 2023-06-25 DIAGNOSIS — I10 Essential (primary) hypertension: Secondary | ICD-10-CM | POA: Diagnosis not present

## 2023-06-25 DIAGNOSIS — Z6841 Body Mass Index (BMI) 40.0 and over, adult: Secondary | ICD-10-CM | POA: Diagnosis not present

## 2023-06-25 NOTE — Progress Notes (Signed)
 Established Patient Visit   Chief Complaint: Follow-up hypertension, hyperlipidemia Chief Complaint  Patient presents with  . Follow-up    Medication refill    Date of Service: 06/25/2023 Date of Birth: [birth year] PCP: Jeffie Cheryl Therman Mickey., MD  History of Present Illness: Chelsea Burke is a 78 y.o.female patient who presented for follow-up of hypertension, hyperlipidemia.  Past medical history significant for hypertension, hyperlipidemia.  Stress test 03/2021 with no ischemia.  Echocardiogram 03/2021 with normal biventricular systolic function, LVEF greater than 55% with no significant valvular abnormality.  Today patient details that she is doing well without any complaints of chest pain/pressure.  Walks her dog out for 15 to 20 minutes at a time without any issues.  No palpitation, dizziness or syncope.  EKG today showed sinus rhythm with first-degree AV block and right bundle blanch block.  Past Medical and Surgical History  Past Medical History Past Medical History:  Diagnosis Date  . ALLERGIC RHINITIS   . Allergic state on file  . 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 Asthma without status asthmaticus (HHS-HCC)   . Atherosclerosis of abdominal aorta (CMS-HCC) 03/03/2019   By ct scan 2015  . Benign essential hypertension 08/27/2014  . Breast pain    DR.DRYNETT FOLLOWS HER MAMMOGRAMS  . Cataract cortical, senile   . Depression   . Dizziness 06/08/2020  . Fatty liver 05/26/2013   05/26/2013 us    . Gall bladder stones 10/10/2018  . GERD (gastroesophageal reflux disease)   . H/O vaginal hysterectomy 10/10/2018  . Heart murmur   . History of adenomatous polyp of colon 05/31/2017  . History of Nissen fundoplication 10/10/2018  . History of nonmelanoma skin cancer 11/25/2019   Formatting of this note is different from the original. Skin Cancer History- Non-Melanoma Skin Cancer  Diagnosis Location Biopsy Date Treatment date Procedure Surgeon  sccis L forehead 09/30/19 Treated with  efudex rebiopsied on 9/8 to check for clearance  . Hyperlipidemia   . Hypertension   . Mixed hyperlipidemia   . Nephrolithiasis 02/08/2012  . Obesity, Class III, BMI 40-49.9 (morbid obesity) (CMS/HHS-HCC)   . Shoulder pain, right   . Stable angina pectoris (CMS-HCC)    Followed by Dr. Hester.  . Venous insufficiency of both lower extremities 08/29/2017    Past Surgical History She has a past surgical history that includes NISSAN FUNDIPLICATION; Sinus surgery; Cholecystectomy; Replacement total knee; replacemnt knee bilat; Hysterectomy; Carpal tunnel release; Joint replacement; Knee arthroscopy; Hernia repair; egd (03/31/2012, 09/02/1991, 04/27/1988); flexible sigmoidoscopy (10/22/1988); Colonoscopy (12/21/2005, 02/12/2001); Colonoscopy (03/31/2012); Colonoscopy (07/19/2017); and egd (07/19/2017).   Medications and Allergies  Current Medications  Current Outpatient Medications  Medication Sig Dispense Refill  . acetaminophen (TYLENOL) 650 MG ER tablet Take 1,300 mg by mouth 2 (two) times daily       . albuterol MDI, PROVENTIL, VENTOLIN, PROAIR, HFA 90 mcg/actuation inhaler Inhale 2 inhalations into the lungs every 6 (six) hours as needed for Wheezing 1 each 0  . ascorbic acid, vitamin C, (VITAMIN C) 500 MG tablet Take 500 mg by mouth once daily    . atorvastatin (LIPITOR) 20 MG tablet Take 1 tablet (20 mg total) by mouth once daily 90 tablet 4  . cetirizine (ZYRTEC) 10 MG tablet Take 10 mg by mouth once daily as needed for Allergies       . cholecalciferol (VITAMIN D3) 2,000 unit capsule Take 2,000 Units by mouth once daily.    . DULoxetine (CYMBALTA) 60 MG DR capsule  Take 1 capsule (60 mg total) by mouth once daily 90 capsule 3  . lutein 20 mg Cap Take 1 tablet by mouth as needed    (Patient taking differently: Take 1 tablet by mouth as needed   )    . telmisartan (MICARDIS) 80 MG tablet Take 1 tablet (80 mg total) by mouth once daily 90 tablet 3  . traZODone (DESYREL) 50 MG tablet TAKE  1 TABLET(50 MG) BY MOUTH AT BEDTIME 90 tablet 3  . vitamin E 400 UNIT capsule Take 400 Units by mouth once daily    . WIXELA INHUB 250-50 mcg/dose diskus inhaler INHALE 1 PUFF INTO THE LUNGS EVERY 12 HOURS 60 each 3   No current facility-administered medications for this visit.    Allergies: Cephalexin, Penicillins, Sulfa (sulfonamide antibiotics), Doxycycline, Norvasc [amlodipine], and Tetanus and diphther. tox (pf)  Social and Family History  Social History  reports that she has never smoked. She has never used smokeless tobacco. She reports that she does not drink alcohol and does not use drugs.  Family History Family History  Problem Relation Name Age of Onset  . Cancer Mother Ronal GORMAN Loud        bladder  . High blood pressure (Hypertension) Mother Ronal GORMAN Loud   . Cancer Father Bogous Shue, Sr   . High blood pressure (Hypertension) Father Bogous Shue, Sr   . Bipolar disorder Daughter Ronal Shines   . Colon polyps Daughter Ronal Shines   . Coronary Artery Disease (Blocked arteries around heart) Brother Fairy JAYSON Loud   . High blood pressure (Hypertension) Brother Fairy JAYSON Loud   . Coronary Artery Disease (Blocked arteries around heart) Brother Lamar VEAR Loud   . High blood pressure (Hypertension) Brother Lamar VEAR Loud   . Coronary Artery Disease (Blocked arteries around heart) Brother Elsie VEAR Loud   . High blood pressure (Hypertension) Brother Elsie VEAR Loud   . Osteoarthritis Maternal Grandmother [employer]     Review of Systems   Review of Systems: The patient denies chest pain, shortness of breath, orthopnea, paroxysmal nocturnal dyspnea, pedal edema, palpitations, heart racing, presyncope, syncope.   Physical Examination   Vitals:BP 124/70   Pulse 80   Ht 165.1 cm (5' 5)   Wt (!) 115.2 kg (254 lb)   LMP 03/19/1972 (Approximate)   SpO2 96%   BMI 42.27 kg/m  Ht:165.1 cm (5' 5) Wt:(!) 115.2 kg (254 lb) ADJ:Anib surface area is 2.3 meters squared. Body mass  index is 42.27 kg/m.  HEENT: Pupils equally reactive to light and accomodation  Neck: Supple, no significant JVD Lungs: clear to auscultation bilaterally; no wheezes, rales, rhonchi Heart: Regular rate and rhythm. No murmur Extremities: no pedal edema  Assessment and Plan   78 y.o. female with  Hypertension Hyperlipidemia Right bundle branch block Morbid obesity  Stable from cardiac standpoint without any anginal symptoms. Blood pressure well-controlled. Continue telmisartan. She is on spironolactone with her prior labs showing potassium on higher side, last potassium 5.  With other antihypertensive options, would prefer to avoid spironolactone.  Patient would like to monitor blood pressure off spironolactone and consider other antihypertensive if needed.  She will call our office if having elevated blood pressure and also has follow-up with PCP in a month.  If needed, can consider hydrochlorothiazide.  She is allergic to amlodipine. Continue statin Regular activity and counseled on weight loss  Orders Placed This Encounter  Procedures  . ECG 12-lead    Return in about 6  months (around 12/25/2023).  KRISHNA CHAITANYA ALLURI, MD  This dictation was prepared with dragon dictation. Any transcription errors that result from this process are unintentional.

## 2023-08-02 DIAGNOSIS — I10 Essential (primary) hypertension: Secondary | ICD-10-CM | POA: Diagnosis not present

## 2023-08-02 DIAGNOSIS — E782 Mixed hyperlipidemia: Secondary | ICD-10-CM | POA: Diagnosis not present

## 2023-08-02 DIAGNOSIS — K219 Gastro-esophageal reflux disease without esophagitis: Secondary | ICD-10-CM | POA: Diagnosis not present

## 2023-08-02 DIAGNOSIS — Z0000 Encounter for general adult medical examination without abnormal findings: Secondary | ICD-10-CM | POA: Diagnosis not present

## 2023-08-02 DIAGNOSIS — F3341 Major depressive disorder, recurrent, in partial remission: Secondary | ICD-10-CM | POA: Diagnosis not present

## 2023-08-02 DIAGNOSIS — I700 Atherosclerosis of aorta: Secondary | ICD-10-CM | POA: Diagnosis not present

## 2023-08-02 DIAGNOSIS — R7302 Impaired glucose tolerance (oral): Secondary | ICD-10-CM | POA: Diagnosis not present

## 2023-08-02 DIAGNOSIS — J4530 Mild persistent asthma, uncomplicated: Secondary | ICD-10-CM | POA: Diagnosis not present

## 2023-08-30 NOTE — Progress Notes (Signed)
 Chelsea Burke, RMA TL   08/30/23  9:57 AM Note    Refill CCM time 5 mins

## 2023-10-30 DIAGNOSIS — R7302 Impaired glucose tolerance (oral): Secondary | ICD-10-CM | POA: Diagnosis not present

## 2023-10-30 DIAGNOSIS — I700 Atherosclerosis of aorta: Secondary | ICD-10-CM | POA: Diagnosis not present

## 2023-10-30 DIAGNOSIS — E782 Mixed hyperlipidemia: Secondary | ICD-10-CM | POA: Diagnosis not present

## 2023-10-30 DIAGNOSIS — F3341 Major depressive disorder, recurrent, in partial remission: Secondary | ICD-10-CM | POA: Diagnosis not present

## 2023-10-30 DIAGNOSIS — I10 Essential (primary) hypertension: Secondary | ICD-10-CM | POA: Diagnosis not present

## 2023-10-30 NOTE — Progress Notes (Signed)
 CCM FOLLOW UP:   [facility] Chronic Care Management Follow Up   Chelsea Burke is a 78 y.o. female who has consented for Chronic Care Management as of 01/09/2022. face to face visit per referral from Feldpausch, Cheryl Therman Raddle., MD. The patient's chronic conditions include  Asthma , Depression/Bipolar/Other Depressive Mood Disorders , GERD , Hyperlipidemia , Hypertension , Joint Replacement , and Obesity   PCP: Jeffie Cheryl Therman Raddle., MD Practice: [facility]    Allergies   Allergies  Allergen Reactions  . Cephalexin Hives and Itching  . Penicillins Rash, Other (See Comments) and Hives    Other Reaction: FEELS ILL  . Sulfa (Sulfonamide Antibiotics) Rash, Swelling and Hives  . Doxycycline Swelling    Tongue swelling   . Norvasc [Amlodipine] Photosensitivity and Other (See Comments)    Feeling of pins and burning  . Tetanus And Diphther. Tox (Pf) Swelling    Current Medications   Prior to Admission medications  Medication Sig Taking? Last Dose  acetaminophen (TYLENOL) 650 MG ER tablet Take 1,300 mg by mouth 2 (two) times daily       albuterol MDI, PROVENTIL, VENTOLIN, PROAIR, HFA 90 mcg/actuation inhaler Inhale 2 inhalations into the lungs every 6 (six) hours as needed for Wheezing    ascorbic acid, vitamin C, (VITAMIN C) 500 MG tablet Take 500 mg by mouth once daily    atorvastatin (LIPITOR) 20 MG tablet Take 1 tablet (20 mg total) by mouth once daily    cetirizine (ZYRTEC) 10 MG tablet Take 10 mg by mouth once daily as needed for Allergies       cholecalciferol (VITAMIN D3) 2,000 unit capsule Take 2,000 Units by mouth once daily.    DULoxetine (CYMBALTA) 60 MG DR capsule TAKE 1 CAPSULE(60 MG) BY MOUTH DAILY    lutein 20 mg Cap Take 1 tablet by mouth as needed    Patient taking differently: Take 1 tablet by mouth once daily    telmisartan (MICARDIS) 80 MG tablet Take 1 tablet (80 mg total) by mouth once daily    traZODone (DESYREL) 50 MG tablet  TAKE 1 TABLET(50 MG) BY MOUTH AT BEDTIME    vitamin E 400 UNIT capsule Take 400 Units by mouth once daily    WIXELA INHUB 250-50 mcg/dose diskus inhaler INHALE 1 PUFF INTO THE LUNGS EVERY 12 HOURS      Any provider appointments since last CPP visit? No Any recent hospitalizations or ED visits? No Any missed medication doses? No Any changes to medications including OTC/herbal products? No Any requested refills? No Any new medication access issues? No Care gaps:  Assessment  Last A1C:  Lab Results  Component Value Date   HGBA1C 5.4 08/02/2023    Hypertension:  Current medications: Telmisartan 80mg  1 tablet qd  Patient reported BP (home readings): 110-130's/70's Monitoring frequency: once a week  Method: (wrist or arm).  Wrist  Hypertensive symptoms: No Hypotensive symptoms: No BP Readings from Last 1 Encounters:  08/02/23 138/80   Hyperlipidemia:  Current medications: Atorvastatin 20mg  1 tablet qd   Lab Results  Component Value Date   HDL 57.9 08/02/2023   Lab Results  Component Value Date   LDLCALC 88 08/02/2023   Lab Results  Component Value Date   TRIG 119 08/02/2023   She does good but feels she could do better with diet and exercise. Not snacking a lot.     Follow-up  Patient has follow up appointment with CPP scheduled on  Future Appointments     Date/Time Provider Department Center Visit Type   12/25/2023 9:45 AM Alluri, Keller Grist, MD [facility] FOLLOW UP   02/20/2024 9:15 AM Feldpausch, Cheryl Therman Raddle., MD [facility] OFFICE VISIT        Time spent directly with patient: 8  minutes  Time spent coordinating care: 12 minutes

## 2023-10-31 ENCOUNTER — Encounter: Payer: Self-pay | Admitting: Urology

## 2023-11-25 DIAGNOSIS — Z8551 Personal history of malignant neoplasm of bladder: Secondary | ICD-10-CM | POA: Insufficient documentation

## 2023-11-25 NOTE — Progress Notes (Unsigned)
   11/27/2023 9:56 AM   Sherron MOLLY SAVARINO 1946/03/13 [id number]  Reason for visit: Follow up hematuria   HPI: 78 year old here for initial follow-up with me, previously seen by Dr. 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   Constitutional:  Alert and oriented, No acute distress.    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   - Cystoscopy (March 2025)-NED   - Bilateral RPG (04/30/2022)-mild left pelviectasis, no filling defects  Initially due for surveillance cystoscopy March 2026  - Will move up planned cystoscopy considering GH, schedule next available - interval CT urogram  - urinalysis today   Orders: -     Urinalysis, Complete w Microscopic; Future -     CT HEMATURIA WORKUP; Future  Gross hematuria Assessment & Plan: Recent bright red GH w/ suprapubic / Left flank pain  Hx of LG NMIBC   Need to exclude recurrent bladder Ca Also a history of possible left UPJO - possible increasing symptoms since last visit- will evaluate on repeat CTU as above, although low threshold to proceed with direct ureteroscopy, exclude upper tract disease vs UPJO Cystoscopy, cytology, + UA as above  Orders: -     Urinalysis, Complete w Microscopic; Future -     CT HEMATURIA WORKUP; Future        [name], MD  [facility] [address], [address] [place], [place] [address] [phone]

## 2023-11-25 NOTE — Assessment & Plan Note (Addendum)
 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

## 2023-11-27 ENCOUNTER — Ambulatory Visit: Admitting: Urology

## 2023-11-27 ENCOUNTER — Ambulatory Visit: Payer: Self-pay

## 2023-11-27 ENCOUNTER — Other Ambulatory Visit
Admission: RE | Admit: 2023-11-27 | Discharge: 2023-11-27 | Disposition: A | Discharge status: Home / Self Care | Attending: Urology | Admitting: Urology

## 2023-11-27 ENCOUNTER — Other Ambulatory Visit: Payer: Self-pay

## 2023-11-27 DIAGNOSIS — R310 Gross hematuria: Secondary | ICD-10-CM | POA: Insufficient documentation

## 2023-11-27 DIAGNOSIS — R109 Unspecified abdominal pain: Secondary | ICD-10-CM | POA: Diagnosis not present

## 2023-11-27 DIAGNOSIS — Z8551 Personal history of malignant neoplasm of bladder: Secondary | ICD-10-CM | POA: Diagnosis not present

## 2023-11-27 LAB — URINALYSIS, COMPLETE (UACMP) WITH MICROSCOPIC
Bilirubin Urine: NEGATIVE
Glucose, UA: NEGATIVE mg/dL
Ketones, ur: NEGATIVE mg/dL
Nitrite: NEGATIVE
Protein, ur: NEGATIVE mg/dL
Specific Gravity, Urine: 1.015 (ref 1.005–1.030)
pH: 5.5 (ref 5.0–8.0)

## 2023-11-27 NOTE — Addendum Note (Signed)
 Addended by: GEORGANNE PENNE SAUNDERS on: 11/27/2023 10:42 AM   Modules accepted: Orders

## 2023-11-27 NOTE — Assessment & Plan Note (Signed)
 Recent bright red GH w/ suprapubic / Left flank pain  Hx of LG NMIBC   Need to exclude recurrent bladder Ca Also a history of possible left UPJO - possible increasing symptoms since last visit- will evaluate on repeat CTU as above, although low threshold to proceed with direct ureteroscopy, exclude upper tract disease vs UPJO Cystoscopy, cytology, + UA as above

## 2023-11-27 NOTE — Addendum Note (Signed)
 Addended byBETHA CORIE PLATER on: 11/27/2023 11:19 AM   Modules accepted: Orders

## 2023-11-27 NOTE — Patient Instructions (Addendum)
 Scheduling number:  [phone]  Cystoscopy Cystoscopy is a procedure that is used to help diagnose and sometimes treat conditions that affect the lower urinary tract. The lower urinary tract includes the bladder and the urethra. The urethra is the tube that drains urine from the bladder. Cystoscopy is done using a thin, tube-shaped instrument with a light and camera at the end (cystoscope). The cystoscope may be hard or flexible, depending on the goal of the procedure. The cystoscope is inserted through the urethra, into the bladder. Cystoscopy may be recommended if you have: Urinary tract infections that keep coming back. Blood in the urine (hematuria). An inability to control when you urinate (urinary incontinence) or an overactive bladder. Unusual cells found in a urine sample. A blockage in the urethra, such as a urinary stone. Painful urination. An abnormality in the bladder found during an intravenous pyelogram (IVP) or CT scan. What are the risks? Generally, this is a safe procedure. However, problems may occur, including: Infection. Bleeding.  What happens during the procedure?  You will be given one or more of the following: A medicine to numb the area (local anesthetic). The area around the opening of your urethra will be cleaned. The cystoscope will be passed through your urethra into your bladder. Germ-free (sterile) fluid will flow through the cystoscope to fill your bladder. The fluid will stretch your bladder so that your health care provider can clearly examine your bladder walls. Your doctor will look at the urethra and bladder. The cystoscope will be removed The procedure may vary among health care providers  What can I expect after the procedure? After the procedure, it is common to have: Some soreness or pain in your urethra. Urinary symptoms. These include: Mild pain or burning when you urinate. Pain should stop within a few minutes after you urinate. This may  last for up to a few days after the procedure. A small amount of blood in your urine for several days. Feeling like you need to urinate but producing only a small amount of urine. Follow these instructions at home: General instructions Return to your normal activities as told by your health care provider.  Drink plenty of fluids after the procedure. Keep all follow-up visits as told by your health care provider. This is important. Contact a health care provider if you: Have pain that gets worse or does not get better with medicine, especially pain when you urinate lasting longer than 72 hours after the procedure. Have trouble urinating. Get help right away if you: Have blood clots in your urine. Have a fever or chills. Are unable to urinate. Summary Cystoscopy is a procedure that is used to help diagnose and sometimes treat conditions that affect the lower urinary tract. Cystoscopy is done using a thin, tube-shaped instrument with a light and camera at the end. After the procedure, it is common to have some soreness or pain in your urethra. It is normal to have blood in your urine after the procedure.  If you were prescribed an antibiotic medicine, take it as told by your health care provider.  This information is not intended to replace advice given to you by your health care provider. Make sure you discuss any questions you have with your health care provider. Document Revised: 02/25/2018 Document Reviewed: 02/25/2018 Elsevier Patient Education  2020 [employer].

## 2023-11-28 LAB — URINE CULTURE: Culture: 20000 — AB

## 2023-12-02 NOTE — Progress Notes (Deleted)
   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 Cystoscopy was performed with a flexible cystoscope. The external vaginal anatomy, urethra, bladder neck and bladder mucosa were visualized in a systematic fashion. The ureteral orifices were noted in orthotopic location and orientation. There were no bladder mucosal lesions, stones, debris or anatomic variants noted.   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

## 2023-12-05 ENCOUNTER — Ambulatory Visit
Admission: RE | Admit: 2023-12-05 | Discharge: 2023-12-05 | Disposition: A | Discharge status: Home / Self Care | Source: Ambulatory Visit | Attending: Urology | Admitting: Urology

## 2023-12-05 DIAGNOSIS — Z8551 Personal history of malignant neoplasm of bladder: Secondary | ICD-10-CM | POA: Insufficient documentation

## 2023-12-05 DIAGNOSIS — R310 Gross hematuria: Secondary | ICD-10-CM | POA: Insufficient documentation

## 2023-12-05 DIAGNOSIS — K5730 Diverticulosis of large intestine without perforation or abscess without bleeding: Secondary | ICD-10-CM | POA: Diagnosis not present

## 2023-12-05 DIAGNOSIS — R1900 Intra-abdominal and pelvic swelling, mass and lump, unspecified site: Secondary | ICD-10-CM | POA: Diagnosis not present

## 2023-12-05 DIAGNOSIS — N200 Calculus of kidney: Secondary | ICD-10-CM | POA: Diagnosis not present

## 2023-12-05 DIAGNOSIS — K654 Sclerosing mesenteritis: Secondary | ICD-10-CM | POA: Diagnosis not present

## 2023-12-05 MED ORDER — IOHEXOL 300 MG/ML  SOLN
100.0000 mL | Freq: Once | INTRAMUSCULAR | Status: AC | PRN
  Administered 2023-12-05: 100 mL via INTRAVENOUS

## 2023-12-05 NOTE — Progress Notes (Signed)
 Can we have Ms. Mcnelly come back in see me at her convenience? Her CT scan revealed a quite large kidney stone. Thank you  Dr. Georganne

## 2023-12-09 NOTE — H&P (View-Only) (Signed)
   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 Cystoscopy was performed with a flexible cystoscope. The external vaginal anatomy, urethra, bladder neck and bladder mucosa were visualized in a systematic fashion. The ureteral orifices were noted in orthotopic location and orientation. There were no bladder mucosal lesions, stones, debris or anatomic variants noted.   Imaging: CTU (12/05/23) -  1. There is a 1.3 x 1.5 cm calculus in the left renal pelvis and a 5 x 5 mm nonobstructing calculus in the right kidney lower pole calyx. No other nephroureterolithiasis or obstructive uropathy on either side. 2. No focal residual bladder mass seen. No metastatic disease identified within the abdomen or pelvis.    Prior HPI:   Physical Exam: BP (!) 150/60   Pulse 84    Constitutional:  Alert and oriented, No acute distress.   Laboratory Data: UA today - pending  Pertinent Imaging: I have personally viewed and interpreted the CT Urogram (12/05/2023)-1.5 cm left UPJ stone, nonobstructive right lower pole stone.  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 I was not expecting this finding- she has known left sided symptoms with mild hydronephrosis, although RBUS from last year suggested a 9mm Left lower pole stone. Stone could have migrated, or stone location indeterminate off RBUS. Either way, she he having fairly classic left flank colic with a substantive stone burden. I would recommend definitive surgery in the form of a left URS/LL. I explained this procedure in detail today, including peri-operative course, expected outcomes, recovery and possible complications. I would anticipate a short duration indwelling ureteral stent following the procedure. Lastly, we would complete her bladder cancer surveillance with cystoscopy as a part of this procedure while she is asleep. All questions answered today.   - schedule for Left URS/LL, cystoscopy with possible bladder biopsy if indicated, next available  Orders: -     Ambulatory Referral For Surgery Scheduling       [name], MD  [facility] [address] [place], [place] [address] [phone]

## 2023-12-09 NOTE — Progress Notes (Signed)
   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 Cystoscopy was performed with a flexible cystoscope. The external vaginal anatomy, urethra, bladder neck and bladder mucosa were visualized in a systematic fashion. The ureteral orifices were noted in orthotopic location and orientation. There were no bladder mucosal lesions, stones, debris or anatomic variants noted.   Imaging: CTU (12/05/23) -  1. There is a 1.3 x 1.5 cm calculus in the left renal pelvis and a 5 x 5 mm nonobstructing calculus in the right kidney lower pole calyx. No other nephroureterolithiasis or obstructive uropathy on either side. 2. No focal residual bladder mass seen. No metastatic disease identified within the abdomen or pelvis.    Prior HPI:   Physical Exam: BP (!) 150/60   Pulse 84    Constitutional:  Alert and oriented, No acute distress.   Laboratory Data: UA today - pending  Pertinent Imaging: I have personally viewed and interpreted the CT Urogram (12/05/2023)-1.5 cm left UPJ stone, nonobstructive right lower pole stone.  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 I was not expecting this finding- she has known left sided symptoms with mild hydronephrosis, although RBUS from last year suggested a 9mm Left lower pole stone. Stone could have migrated, or stone location indeterminate off RBUS. Either way, she he having fairly classic left flank colic with a substantive stone burden. I would recommend definitive surgery in the form of a left URS/LL. I explained this procedure in detail today, including peri-operative course, expected outcomes, recovery and possible complications. I would anticipate a short duration indwelling ureteral stent following the procedure. Lastly, we would complete her bladder cancer surveillance with cystoscopy as a part of this procedure while she is asleep. All questions answered today.   - schedule for Left URS/LL, cystoscopy with possible bladder biopsy if indicated, next available  Orders: -     Ambulatory Referral For Surgery Scheduling       [name], MD  [facility] [address] [place], [place] [address] [phone]

## 2023-12-11 ENCOUNTER — Other Ambulatory Visit: Admitting: Urology

## 2023-12-18 ENCOUNTER — Other Ambulatory Visit: Payer: Self-pay

## 2023-12-18 ENCOUNTER — Ambulatory Visit: Admitting: Urology

## 2023-12-18 ENCOUNTER — Other Ambulatory Visit
Admission: RE | Admit: 2023-12-18 | Discharge: 2023-12-18 | Disposition: A | Discharge status: Home / Self Care | Attending: Urology | Admitting: Urology

## 2023-12-18 VITALS — BP 150/60 | HR 84

## 2023-12-18 DIAGNOSIS — Z8551 Personal history of malignant neoplasm of bladder: Secondary | ICD-10-CM

## 2023-12-18 DIAGNOSIS — N200 Calculus of kidney: Secondary | ICD-10-CM | POA: Diagnosis not present

## 2023-12-18 LAB — URINALYSIS, COMPLETE (UACMP) WITH MICROSCOPIC
Bilirubin Urine: NEGATIVE
Glucose, UA: NEGATIVE mg/dL
Ketones, ur: NEGATIVE mg/dL
Nitrite: NEGATIVE
Protein, ur: NEGATIVE mg/dL
RBC / HPF: >50 RBC/hpf (ref 0–5)
Specific Gravity, Urine: 1.02 (ref 1.005–1.030)
pH: 6 (ref 5.0–8.0)

## 2023-12-18 NOTE — Assessment & Plan Note (Signed)
 1.5 cm Left UPJ stone, mild hydronephrosis Intermittent Left flank colic  I reviewed her CTU results with her today. I was not expecting this finding- she has known left sided symptoms with mild hydronephrosis, although RBUS from last year suggested a 9mm Left lower pole stone. Stone could have migrated, or stone location indeterminate off RBUS. Either way, she he having fairly classic left flank colic with a substantive stone burden. I would recommend definitive surgery in the form of a left URS/LL. I explained this procedure in detail today, including peri-operative course, expected outcomes, recovery and possible complications. I would anticipate a short duration indwelling ureteral stent following the procedure. Lastly, we would complete her bladder cancer surveillance with cystoscopy as a part of this procedure while she is asleep. All questions answered today.   - schedule for Left URS/LL, cystoscopy with possible bladder biopsy if indicated, next available

## 2023-12-18 NOTE — Assessment & Plan Note (Signed)
 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

## 2023-12-18 NOTE — Progress Notes (Unsigned)
 Surgical Physician Order Form [facility]  Dr. Penne Skye, MD  * Scheduling expectation : Next Available  *Length of Case: 60 min  *Clearance needed: no  *Anticoagulation Instructions: N/A  *Aspirin Instructions: Ok to continue Aspirin  *Post-op visit Date/Instructions:  TBD  *Diagnosis: Left Nephrolithiasis  *Procedure: left Ureteroscopy w/laser lithotripsy & stent placement (47643)   Additional orders: N/A  -Admit type: OUTpatient  -Anesthesia: General  -VTE Prophylaxis Standing Order SCD's       Other:   -Standing Lab Orders Per Anesthesia    Lab other: None  -Standing Test orders EKG/Chest x-ray per Anesthesia       Test other:   - Medications:  Ancef 2gm IV  -Other orders:  N/A

## 2023-12-19 ENCOUNTER — Telehealth: Payer: Self-pay

## 2023-12-19 DIAGNOSIS — N200 Calculus of kidney: Secondary | ICD-10-CM

## 2023-12-19 NOTE — Telephone Encounter (Signed)
 Per Dr. Georganne, Patient is to be scheduled for Left Ureteroscopy with Laser Lithotripsy and Stent Placement   Chelsea Burke was contacted and possible surgical dates were discussed, Tuesday October 14th, 2025  was agreed upon for surgery.   Patient was directed to call [phone] between 1-3pm the day before surgery to find out surgical arrival time.  Instructions were given not to eat or drink from midnight on the night before surgery and have a driver for the day of surgery. On the surgery day patient was instructed to enter through the Medical Mall entrance of [facility] report the Same Day Surgery desk.   Pre-Admit Testing will be in contact via phone to set up an interview with the anesthesia team to review your history and medications prior to surgery.   Reminder of this information was sent via MyChart to the patient.

## 2023-12-19 NOTE — Progress Notes (Signed)
   [facility] Surgical Posting Form  Surgery Date: Date: 12/31/2023  Surgeon: Dr. Penne Skye, MD  Inpt ( No  )   Outpt (Yes)   Obs ( No  )   Diagnosis: N20.0 Left Nephrolithiasis  -CPT: [phone]  Surgery: Left Ureteroscopy with Laser Lithotripsy and Stent Placement  Stop Anticoagulations: No, may continue ASA  Cardiac/Medical/Pulmonary Clearance needed: no  *Orders entered into EPIC  Date: 12/19/23   *Case booked in EPIC  Date: 12/19/23  *Notified pt of Surgery: Date: 12/19/23  PRE-OP UA & CX: yes, obtained in clinic 12/18/2023  *Placed into Prior Authorization Work [place] Date: 12/19/23  Assistant/laser/rep:No

## 2023-12-20 LAB — URINE CULTURE: Culture: 50000 — AB

## 2023-12-25 ENCOUNTER — Other Ambulatory Visit: Payer: Self-pay

## 2023-12-25 ENCOUNTER — Encounter
Admission: RE | Admit: 2023-12-25 | Discharge: 2023-12-25 | Disposition: A | Discharge status: Home / Self Care | Source: Ambulatory Visit | Attending: Urology | Admitting: Urology

## 2023-12-25 DIAGNOSIS — R310 Gross hematuria: Secondary | ICD-10-CM

## 2023-12-25 DIAGNOSIS — I10 Essential (primary) hypertension: Secondary | ICD-10-CM

## 2023-12-25 DIAGNOSIS — Z01810 Encounter for preprocedural cardiovascular examination: Secondary | ICD-10-CM

## 2023-12-25 DIAGNOSIS — N200 Calculus of kidney: Secondary | ICD-10-CM

## 2023-12-25 HISTORY — DX: Malignant (primary) neoplasm, unspecified: C80.1

## 2023-12-25 HISTORY — DX: Sleep apnea, unspecified: G47.30

## 2023-12-25 NOTE — Patient Instructions (Addendum)
 Your procedure is scheduled on: 12/31/23 - Tuesday Report to the Registration Desk on the 1st floor of the Medical Mall. To find out your arrival time, please call [phone] between 1PM - 3PM on: 12/30/23 - Monday If your arrival time is 6:00 am, do not arrive before that time as the Medical Mall entrance doors do not open until 6:00 am.  REMEMBER: Instructions that are not followed completely may result in serious medical risk, up to and including death; or upon the discretion of your surgeon and anesthesiologist your surgery may need to be rescheduled.  Do not eat food or drink any liquids  after midnight the night before surgery.  No gum chewing or hard candies.  One week prior to surgery: Stop Anti-inflammatories (NSAIDS) such as Advil, Aleve, Ibuprofen, Motrin, Naproxen, Naprosyn and Aspirin based products such as Excedrin, Goody's Powder, BC Powder. You may continue to take Tylenol if needed for pain up until the day of surgery.  Stop ANY OVER THE COUNTER supplements until after surgery : Vitamin C, Lutein, VITAMIN D3 , vitamin E .  ON THE DAY OF SURGERY ONLY TAKE THESE MEDICATIONS WITH SIPS OF WATER :  none  Use inhalers on the day of surgery and bring to the hospital.   No Alcohol for 24 hours before or after surgery.  No Smoking including e-cigarettes for 24 hours before surgery.  No chewable tobacco products for at least 6 hours before surgery.  No nicotine patches on the day of surgery.  Do not use any recreational drugs for at least a week (preferably 2 weeks) before your surgery.  Please be advised that the combination of cocaine and anesthesia may have negative outcomes, up to and including death. If you test positive for cocaine, your surgery will be cancelled.  On the morning of surgery brush your teeth with toothpaste and water , you may rinse your mouth with mouthwash if you wish. Do not swallow any toothpaste or mouthwash.  Do not wear jewelry, make-up,  hairpins, clips or nail polish.  For welded (permanent) jewelry: bracelets, anklets, waist bands, etc.  Please have this removed prior to surgery.  If it is not removed, there is a chance that hospital personnel will need to cut it off on the day of surgery.  Do not wear lotions, powders, or perfumes.   Do not shave body hair from the neck down 48 hours before surgery.  Contact lenses, hearing aids and dentures may not be worn into surgery.  Do not bring valuables to the hospital. [facility] is not responsible for any missing/lost belongings or valuables.   Notify your doctor if there is any change in your medical condition (cold, fever, infection).  Wear comfortable clothing (specific to your surgery type) to the hospital.  After surgery, you can help prevent lung complications by doing breathing exercises.  Take deep breaths and cough every 1-2 hours. Your doctor may order a device called an Incentive Spirometer to help you take deep breaths.  When coughing or sneezing, hold a pillow firmly against your incision with both hands. This is called "splinting." Doing this helps protect your incision. It also decreases belly discomfort.  If you are being admitted to the hospital overnight, leave your suitcase in the car. After surgery it may be brought to your room.  In case of increased patient census, it may be necessary for you, the patient, to continue your postoperative care in the Same Day Surgery department.  If you are being discharged the  day of surgery, you will not be allowed to drive home. You will need a responsible individual to drive you home and stay with you for 24 hours after surgery.   If you are taking public transportation, you will need to have a responsible individual with you.  Please call the Pre-admissions Testing Dept. at [phone] if you have any questions about these instructions.  Surgery Visitation Policy:  Patients having surgery or a procedure may  have two visitors.  Children under the age of 50 must have an adult with them who is not the patient.  Inpatient Visitation:    Visiting hours are 7 a.m. to 8 p.m. Up to four visitors are allowed at one time in a patient room. The visitors may rotate out with other people during the day.  One visitor age 59 or older may stay with the patient overnight and must be in the room by 8 p.m.   Merchandiser, retail to address health-related social needs:  [email or web address]

## 2023-12-26 ENCOUNTER — Encounter
Admission: RE | Admit: 2023-12-26 | Discharge: 2023-12-26 | Disposition: A | Discharge status: Home / Self Care | Source: Ambulatory Visit | Attending: Urology | Admitting: Urology

## 2023-12-26 ENCOUNTER — Other Ambulatory Visit: Payer: Self-pay | Admitting: Urology

## 2023-12-26 DIAGNOSIS — R310 Gross hematuria: Secondary | ICD-10-CM | POA: Insufficient documentation

## 2023-12-26 DIAGNOSIS — Z01818 Encounter for other preprocedural examination: Secondary | ICD-10-CM | POA: Insufficient documentation

## 2023-12-26 DIAGNOSIS — I10 Essential (primary) hypertension: Secondary | ICD-10-CM | POA: Insufficient documentation

## 2023-12-26 DIAGNOSIS — Z01810 Encounter for preprocedural cardiovascular examination: Secondary | ICD-10-CM

## 2023-12-26 DIAGNOSIS — Z01812 Encounter for preprocedural laboratory examination: Secondary | ICD-10-CM | POA: Diagnosis present

## 2023-12-26 DIAGNOSIS — I452 Bifascicular block: Secondary | ICD-10-CM | POA: Diagnosis not present

## 2023-12-26 LAB — BASIC METABOLIC PANEL WITH GFR
Anion gap: 7 (ref 5–15)
BUN: 11 mg/dL (ref 8–23)
CO2: 27 mmol/L (ref 22–32)
Calcium: 8.8 mg/dL — ABNORMAL LOW (ref 8.9–10.3)
Chloride: 108 mmol/L (ref 98–111)
Creatinine, Ser: 0.74 mg/dL (ref 0.44–1.00)
GFR, Estimated: >60 mL/min (ref >60)
Glucose, Bld: 122 mg/dL — ABNORMAL HIGH (ref 70–99)
Potassium: 3.5 mmol/L (ref 3.5–5.1)
Sodium: 142 mmol/L (ref 135–145)

## 2023-12-26 LAB — CBC
HCT: 40.2 % (ref 36.0–46.0)
Hemoglobin: 13 g/dL (ref 12.0–15.0)
MCH: 30.3 pg (ref 26.0–34.0)
MCHC: 32.3 g/dL (ref 30.0–36.0)
MCV: 93.7 fL (ref 80.0–100.0)
Platelets: 201 K/uL (ref 150–400)
RBC: 4.29 MIL/uL (ref 3.87–5.11)
RDW: 13.3 % (ref 11.5–15.5)
WBC: 5.7 K/uL (ref 4.0–10.5)
nRBC: 0 % (ref 0.0–0.2)

## 2023-12-26 MED ORDER — NITROFURANTOIN MONOHYD MACRO 100 MG PO CAPS: 100.0000 mg | ORAL_CAPSULE | Freq: Two times a day (BID) | ORAL | 0 refills | Status: AC

## 2023-12-26 NOTE — Progress Notes (Signed)
 Sending in Macrobid for recent urine culture-- which may be colonization vs contamination, although with upcoming stone surgery would prefer to sterilize urine. She has a history of a rash with PCN, low risk allergy. She has allergies to most oral antibiotic drug classes.

## 2023-12-30 NOTE — Anesthesia Preprocedure Evaluation (Signed)
 Anesthesia Evaluation  Patient identified by MRN, date of birth, ID band Patient awake    Reviewed: Allergy & Precautions, H&P , NPO status , Patient's Chart, lab work & pertinent test results, reviewed documented beta blocker date and time   Airway Mallampati: III   Neck ROM: full    Dental  (+) Edentulous Lower, Edentulous Upper, Dental Advidsory Given   Pulmonary neg pulmonary ROS, shortness of breath and with exertion, asthma    Pulmonary exam normal        Cardiovascular Exercise Tolerance: Poor hypertension, On Medications (-) angina (-) Past MI and (-) CABG Normal cardiovascular exam+ dysrhythmias (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  Echocardiogram 03/2021 with normal biventricular systolic function, LVEF greater than 55% with no significant valvular abnormality   Neuro/Psych  PSYCHIATRIC DISORDERS  Depression    negative neurological ROS  negative psych ROS   GI/Hepatic negative GI ROS, Neg liver ROS, hiatal hernia,GERD  Medicated,,  Endo/Other  negative endocrine ROS  Class 3 obesity  Renal/GU negative Renal ROS  negative genitourinary   Musculoskeletal   Abdominal   Peds  Hematology negative hematology ROS (+)   Anesthesia Other Findings Past Medical History: No date: Arthritis No date: Asthma No date: Depression No date: Dyspnea No date: Fatty liver No date: GERD (gastroesophageal reflux disease) No date: History of hiatal hernia No date: History of kidney stones No date: Hyperlipidemia No date: Hypertension Past Surgical History: No date: ABDOMINAL HYSTERECTOMY 03/2006: BREAST BIOPSY; Bilateral     Comment:  bil bx/clips-neg No date: CHOLECYSTECTOMY No date: NASAL SINUS SURGERY No date: NISSEN FUNDOPLICATION No date: TOTAL KNEE ARTHROPLASTY   Reproductive/Obstetrics negative OB ROS                               Anesthesia Physical Anesthesia Plan  ASA: 3  Anesthesia Plan: General   Post-op Pain Management:    Induction: Intravenous  PONV Risk Score and Plan: 3  Airway Management Planned: Oral ETT  Additional Equipment:   Intra-op Plan:   Post-operative Plan: Extubation in OR  Informed Consent:      Dental Advisory Given  Plan Discussed with: CRNA  Anesthesia Plan Comments:          Anesthesia Quick Evaluation

## 2023-12-31 ENCOUNTER — Encounter
Admission: RE | Disposition: A | Discharge status: Home / Self Care | Payer: Self-pay | Source: Home / Self Care | Attending: Urology

## 2023-12-31 ENCOUNTER — Other Ambulatory Visit: Payer: Self-pay

## 2023-12-31 ENCOUNTER — Encounter: Payer: Self-pay | Admitting: Urology

## 2023-12-31 ENCOUNTER — Ambulatory Visit: Payer: Self-pay | Admitting: Urgent Care

## 2023-12-31 ENCOUNTER — Ambulatory Visit
Admission: RE | Admit: 2023-12-31 | Discharge: 2023-12-31 | Disposition: A | Discharge status: Home / Self Care | Attending: Urology | Admitting: Urology

## 2023-12-31 ENCOUNTER — Ambulatory Visit

## 2023-12-31 DIAGNOSIS — I4510 Unspecified right bundle-branch block: Secondary | ICD-10-CM | POA: Diagnosis not present

## 2023-12-31 DIAGNOSIS — N200 Calculus of kidney: Secondary | ICD-10-CM | POA: Diagnosis not present

## 2023-12-31 DIAGNOSIS — Z8551 Personal history of malignant neoplasm of bladder: Secondary | ICD-10-CM | POA: Diagnosis not present

## 2023-12-31 DIAGNOSIS — I440 Atrioventricular block, first degree: Secondary | ICD-10-CM | POA: Diagnosis not present

## 2023-12-31 DIAGNOSIS — E66813 Obesity, class 3: Secondary | ICD-10-CM | POA: Insufficient documentation

## 2023-12-31 DIAGNOSIS — I10 Essential (primary) hypertension: Secondary | ICD-10-CM | POA: Diagnosis not present

## 2023-12-31 DIAGNOSIS — K219 Gastro-esophageal reflux disease without esophagitis: Secondary | ICD-10-CM | POA: Diagnosis not present

## 2023-12-31 DIAGNOSIS — N135 Crossing vessel and stricture of ureter without hydronephrosis: Secondary | ICD-10-CM | POA: Diagnosis not present

## 2023-12-31 DIAGNOSIS — G4730 Sleep apnea, unspecified: Secondary | ICD-10-CM | POA: Diagnosis not present

## 2023-12-31 DIAGNOSIS — K449 Diaphragmatic hernia without obstruction or gangrene: Secondary | ICD-10-CM | POA: Insufficient documentation

## 2023-12-31 DIAGNOSIS — N131 Hydronephrosis with ureteral stricture, not elsewhere classified: Secondary | ICD-10-CM | POA: Diagnosis not present

## 2023-12-31 DIAGNOSIS — Z6841 Body Mass Index (BMI) 40.0 and over, adult: Secondary | ICD-10-CM | POA: Diagnosis not present

## 2023-12-31 DIAGNOSIS — Z79899 Other long term (current) drug therapy: Secondary | ICD-10-CM | POA: Insufficient documentation

## 2023-12-31 DIAGNOSIS — E782 Mixed hyperlipidemia: Secondary | ICD-10-CM | POA: Diagnosis not present

## 2023-12-31 DIAGNOSIS — F32A Depression, unspecified: Secondary | ICD-10-CM | POA: Diagnosis not present

## 2023-12-31 DIAGNOSIS — N132 Hydronephrosis with renal and ureteral calculous obstruction: Secondary | ICD-10-CM | POA: Diagnosis not present

## 2023-12-31 HISTORY — PX: CYSTOSCOPY/URETEROSCOPY/HOLMIUM LASER/STENT PLACEMENT: SHX6546

## 2023-12-31 HISTORY — PX: CYSTOSCOPY WITH URETHRAL DILATATION: SHX5125

## 2023-12-31 SURGERY — CYSTOSCOPY/URETEROSCOPY/HOLMIUM LASER/STENT PLACEMENT
Anesthesia: General | Site: Ureter | Laterality: Right

## 2023-12-31 MED ORDER — SUGAMMADEX SODIUM 200 MG/2ML IV SOLN
INTRAVENOUS | Status: DC | PRN
  Administered 2023-12-31: 200 mg via INTRAVENOUS

## 2023-12-31 MED ORDER — FENTANYL CITRATE (PF) 100 MCG/2ML IJ SOLN
INTRAMUSCULAR | Status: AC
  Filled 2023-12-31: qty 2

## 2023-12-31 MED ORDER — DROPERIDOL 2.5 MG/ML IJ SOLN
INTRAMUSCULAR | Status: AC
Start: 2023-12-31 — End: 2023-12-31
  Filled 2023-12-31: qty 2

## 2023-12-31 MED ORDER — LACTATED RINGERS IV SOLN: INTRAVENOUS | Status: DC

## 2023-12-31 MED ORDER — FENTANYL CITRATE (PF) 100 MCG/2ML IJ SOLN
INTRAMUSCULAR | Status: DC | PRN
  Administered 2023-12-31 (×2): 50 ug via INTRAVENOUS

## 2023-12-31 MED ORDER — CHLORHEXIDINE GLUCONATE 0.12 % MT SOLN
OROMUCOSAL | Status: AC
  Filled 2023-12-31: qty 15

## 2023-12-31 MED ORDER — IOHEXOL 180 MG/ML  SOLN
INTRAMUSCULAR | Status: DC | PRN
  Administered 2023-12-31: 30 mL

## 2023-12-31 MED ORDER — CHLORHEXIDINE GLUCONATE 0.12 % MT SOLN
15.0000 mL | Freq: Once | OROMUCOSAL | Status: AC
  Administered 2023-12-31: 15 mL via OROMUCOSAL

## 2023-12-31 MED ORDER — LIDOCAINE HCL (PF) 2 % IJ SOLN
INTRAMUSCULAR | Status: AC
  Filled 2023-12-31: qty 5

## 2023-12-31 MED ORDER — TRAMADOL HCL 50 MG PO TABS
25.0000 mg | ORAL_TABLET | Freq: Once | ORAL | Status: AC
  Administered 2023-12-31: 25 mg via ORAL

## 2023-12-31 MED ORDER — CEFAZOLIN SODIUM-DEXTROSE 2-4 GM/100ML-% IV SOLN
INTRAVENOUS | Status: AC
Start: 2023-12-31 — End: 2023-12-31
  Filled 2023-12-31: qty 100

## 2023-12-31 MED ORDER — OXYCODONE HCL 5 MG PO TABS: 5.0000 mg | ORAL_TABLET | Freq: Once | ORAL | Status: DC | PRN

## 2023-12-31 MED ORDER — CEFAZOLIN SODIUM-DEXTROSE 2-4 GM/100ML-% IV SOLN
2.0000 g | INTRAVENOUS | Status: AC
  Administered 2023-12-31: 2 g via INTRAVENOUS
  Administered 2023-12-31: 1 g via INTRAVENOUS

## 2023-12-31 MED ORDER — ORAL CARE MOUTH RINSE: 15.0000 mL | Freq: Once | OROMUCOSAL | Status: AC

## 2023-12-31 MED ORDER — TRAMADOL HCL 50 MG PO TABS: 25.0000 mg | ORAL_TABLET | Freq: Once | ORAL | Status: DC | PRN

## 2023-12-31 MED ORDER — ACETAMINOPHEN 10 MG/ML IV SOLN
INTRAVENOUS | Status: AC
  Filled 2023-12-31: qty 100

## 2023-12-31 MED ORDER — OXYCODONE HCL 5 MG/5ML PO SOLN: 5.0000 mg | Freq: Once | ORAL | Status: DC | PRN

## 2023-12-31 MED ORDER — STERILE WATER FOR IRRIGATION IR SOLN
Status: DC | PRN
Start: 2023-12-31 — End: 2023-12-31
  Administered 2023-12-31: 500 mL

## 2023-12-31 MED ORDER — TRAMADOL HCL 50 MG PO TABS
ORAL_TABLET | ORAL | Status: AC
  Filled 2023-12-31: qty 1

## 2023-12-31 MED ORDER — HYDRALAZINE HCL 20 MG/ML IJ SOLN
INTRAMUSCULAR | Status: AC
  Filled 2023-12-31: qty 2

## 2023-12-31 MED ORDER — PHENYLEPHRINE 80 MCG/ML (10ML) SYRINGE FOR IV PUSH (FOR BLOOD PRESSURE SUPPORT)
PREFILLED_SYRINGE | INTRAVENOUS | Status: AC
  Filled 2023-12-31: qty 10

## 2023-12-31 MED ORDER — HYDRALAZINE HCL 20 MG/ML IJ SOLN
INTRAMUSCULAR | Status: DC | PRN
  Administered 2023-12-31: 10 mg via INTRAVENOUS

## 2023-12-31 MED ORDER — SODIUM CHLORIDE 0.9 % IR SOLN
Status: DC | PRN
  Administered 2023-12-31: 3000 mL

## 2023-12-31 MED ORDER — DROPERIDOL 2.5 MG/ML IJ SOLN
0.6250 mg | Freq: Once | INTRAMUSCULAR | Status: AC | PRN
  Administered 2023-12-31: 0.625 mg via INTRAVENOUS

## 2023-12-31 MED ORDER — PROPOFOL 10 MG/ML IV BOLUS
INTRAVENOUS | Status: DC | PRN
  Administered 2023-12-31: 150 mg via INTRAVENOUS
  Administered 2023-12-31: 50 mg via INTRAVENOUS

## 2023-12-31 MED ORDER — ONDANSETRON HCL 4 MG/2ML IJ SOLN
INTRAMUSCULAR | Status: DC | PRN
  Administered 2023-12-31: 4 mg via INTRAVENOUS

## 2023-12-31 MED ORDER — ACETAMINOPHEN 10 MG/ML IV SOLN
INTRAVENOUS | Status: DC | PRN
  Administered 2023-12-31: 1000 mg via INTRAVENOUS

## 2023-12-31 MED ORDER — ACETAMINOPHEN 10 MG/ML IV SOLN: 1000.0000 mg | Freq: Once | INTRAVENOUS | Status: DC | PRN

## 2023-12-31 MED ORDER — PHENAZOPYRIDINE HCL 200 MG PO TABS: 200.0000 mg | ORAL_TABLET | Freq: Three times a day (TID) | ORAL | 0 refills | Status: AC | PRN

## 2023-12-31 MED ORDER — OXYBUTYNIN CHLORIDE ER 5 MG PO TB24: 5.0000 mg | ORAL_TABLET | Freq: Every day | ORAL | 0 refills | Status: AC

## 2023-12-31 MED ORDER — ROCURONIUM BROMIDE 100 MG/10ML IV SOLN
INTRAVENOUS | Status: DC | PRN
  Administered 2023-12-31: 30 mg via INTRAVENOUS
  Administered 2023-12-31: 60 mg via INTRAVENOUS

## 2023-12-31 MED ORDER — TAMSULOSIN HCL 0.4 MG PO CAPS: 0.4000 mg | ORAL_CAPSULE | Freq: Every day | ORAL | 0 refills | Status: AC

## 2023-12-31 MED ORDER — DEXAMETHASONE SOD PHOSPHATE PF 10 MG/ML IJ SOLN
INTRAMUSCULAR | Status: DC | PRN
Start: 2023-12-31 — End: 2023-12-31
  Administered 2023-12-31: 10 mg via INTRAVENOUS

## 2023-12-31 MED ORDER — LIDOCAINE HCL (CARDIAC) PF 100 MG/5ML IV SOSY
PREFILLED_SYRINGE | INTRAVENOUS | Status: DC | PRN
  Administered 2023-12-31: 60 mg via INTRAVENOUS
  Administered 2023-12-31: 40 mg via INTRAVENOUS

## 2023-12-31 MED ORDER — CEFAZOLIN SODIUM-DEXTROSE 2-4 GM/100ML-% IV SOLN
INTRAVENOUS | Status: AC
  Filled 2023-12-31: qty 100

## 2023-12-31 MED ORDER — PHENYLEPHRINE 80 MCG/ML (10ML) SYRINGE FOR IV PUSH (FOR BLOOD PRESSURE SUPPORT)
PREFILLED_SYRINGE | INTRAVENOUS | Status: DC | PRN
  Administered 2023-12-31: 40 ug via INTRAVENOUS

## 2023-12-31 MED ORDER — ROCURONIUM BROMIDE 10 MG/ML (PF) SYRINGE
PREFILLED_SYRINGE | INTRAVENOUS | Status: AC
  Filled 2023-12-31: qty 10

## 2023-12-31 MED ORDER — FENTANYL CITRATE (PF) 100 MCG/2ML IJ SOLN: 25.0000 ug | INTRAMUSCULAR | Status: DC | PRN

## 2023-12-31 SURGICAL SUPPLY — 26 items
ADHESIVE MASTISOL STRL (MISCELLANEOUS) IMPLANT
BAG DRAIN SIEMENS DORNER NS (MISCELLANEOUS) ×2 IMPLANT
BAG PRESSURE INF REUSE 3000 (BAG) ×2 IMPLANT
BASKET ZERO TIP 1.9FR (BASKET) IMPLANT
BRUSH SCRUB EZ 4% CHG (MISCELLANEOUS) ×2 IMPLANT
CATH URET FLEX-TIP 2 LUMEN 10F (CATHETERS) IMPLANT
CATH URETL OPEN 5X70 (CATHETERS) IMPLANT
DRAPE UTILITY 15X26 TOWEL STRL (DRAPES) ×2 IMPLANT
DRSG TEGADERM 2-3/8X2-3/4 SM (GAUZE/BANDAGES/DRESSINGS) IMPLANT
FIBER LASER MOSES 200 DFL (Laser) IMPLANT
FIBER LASER MOSES 365 DFL (Laser) IMPLANT
GLOVE BIOGEL PI IND STRL 7.0 (GLOVE) ×2 IMPLANT
GOWN STRL REUS W/ TWL LRG LVL3 (GOWN DISPOSABLE) ×4 IMPLANT
GUIDEWIRE STR DUAL SENSOR (WIRE) ×2 IMPLANT
KIT BALLN UROMAX 15FX4 (MISCELLANEOUS) IMPLANT
KIT TURNOVER CYSTO (KITS) ×2 IMPLANT
PACK CYSTO AR (MISCELLANEOUS) ×2 IMPLANT
SET CYSTO IRRIGATION (SET/KITS/TRAYS/PACK) ×2 IMPLANT
SHEATH NAVIGATOR HD 12/14X36 (SHEATH) IMPLANT
SOL .9 NS 3000ML IRR UROMATIC (IV SOLUTION) ×2 IMPLANT
STENT URET 6FRX24 CONTOUR (STENTS) IMPLANT
STENT URET 6FRX26 CONTOUR (STENTS) IMPLANT
SURGILUBE 2OZ TUBE FLIPTOP (MISCELLANEOUS) ×2 IMPLANT
SYR 10ML LL (SYRINGE) ×2 IMPLANT
VALVE UROSEAL ADJ ENDO (VALVE) IMPLANT
WATER STERILE IRR 500ML POUR (IV SOLUTION) ×2 IMPLANT

## 2023-12-31 NOTE — Discharge Instructions (Addendum)
 DISCHARGE INSTRUCTIONS FOR KIDNEY STONE/URETERAL STENT   MEDICATIONS:  1.  Resume all your other meds from home - except do not take any extra narcotic pain meds that you may have at home.  2. Pyridium is to help with the burning/stinging when you urinate.  ACTIVITY:  1. No strenuous activity x 1week  2. No driving while on narcotic pain medications  3. Drink plenty of water   4. Continue to walk at home - you can still get blood clots when you are at home, so keep active, but don't over do it.  5. May return to work/school tomorrow or when you feel ready   BATHING:  1. You can shower and we recommend daily showers  2. You have a string coming from your urethra: The stent string is attached to your ureteral stent. Do not pull on this.   SIGNS/SYMPTOMS TO CALL:  Please call us  if you have a fever greater than 101.5, uncontrolled nausea/vomiting, uncontrolled pain, dizziness, unable to urinate, bloody urine, chest pain, shortness of breath, leg swelling, leg pain, redness around wound, drainage from wound, or any other concerns or questions.   You can reach us  at [phone].   FOLLOW-UP:  1. Follow up in 10-14 days in the Urology clinic for cystoscopy and ureteral stent removal. Your stent is fully internal and will need to be removed in clinic.

## 2023-12-31 NOTE — Interval H&P Note (Signed)
 History and Physical Interval Note:  12/31/2023 8:05 AM  Chelsea Burke  has presented today for surgery, with the diagnosis of Left Nephrolithiasis.  The various methods of treatment have been discussed with the patient and family. After consideration of risks, benefits and other options for treatment, the patient has consented to  Procedure(s): CYSTOSCOPY/URETEROSCOPY/HOLMIUM LASER/STENT PLACEMENT (Left) as a surgical intervention.  The patient's history has been reviewed, patient examined, no change in status, stable for surgery.  I have reviewed the patient's chart and labs.  Questions were answered to the patient's satisfaction.     [name]

## 2023-12-31 NOTE — Anesthesia Postprocedure Evaluation (Signed)
 Anesthesia Post Note  Patient: Chelsea Burke  Procedure(s) Performed: CYSTOSCOPY/URETEROSCOPY/HOLMIUM LASER/STENT PLACEMENT (Left: Ureter) CYSTOSCOPY, WITH URETERAL DILATION (Right: Ureter)  Patient location during evaluation: PACU Anesthesia Type: General Level of consciousness: awake and alert Pain management: pain level controlled Vital Signs Assessment: post-procedure vital signs reviewed and stable Respiratory status: spontaneous breathing, nonlabored ventilation and respiratory function stable Cardiovascular status: blood pressure returned to baseline and stable Postop Assessment: no apparent nausea or vomiting Anesthetic complications: no   No notable events documented.   Last Vitals:  Vitals:   12/31/23 1015 12/31/23 1028  BP: (!) 158/64 (!) 166/58  Pulse: 77 65  Resp: 11 15  Temp: 36.8 C (!) 36.4 C  SpO2: 96% 98%    Last Pain:  Vitals:   12/31/23 1110  TempSrc:   PainSc: 3                  [name]

## 2023-12-31 NOTE — Transfer of Care (Signed)
 Immediate Anesthesia Transfer of Care Note  Patient: Chelsea Burke  Procedure(s) Performed: CYSTOSCOPY/URETEROSCOPY/HOLMIUM LASER/STENT PLACEMENT (Left: Ureter) CYSTOSCOPY, WITH URETERAL DILATION (Right: Ureter)  Patient Location: PACU  Anesthesia Type:General  Level of Consciousness: sedated and drowsy  Airway & Oxygen Therapy: Patient Spontanous Breathing and Patient connected to face mask oxygen  Post-op Assessment: Report given to RN and Post -op Vital signs reviewed and stable  Post vital signs: Reviewed and stable  Last Vitals:  Vitals Value Taken Time  BP 196/80 12/31/23 09:49  Temp    Pulse 75 12/31/23 09:51  Resp 10 12/31/23 09:51  SpO2 100 % 12/31/23 09:51  Vitals shown include unfiled device data.  Last Pain:  Vitals:   12/31/23 0749  TempSrc: Temporal  PainSc: 0-No pain         Complications: No notable events documented.

## 2023-12-31 NOTE — Anesthesia Procedure Notes (Signed)
 Procedure Name: Intubation Date/Time: 12/31/2023 8:25 AM  Performed by: Trudy Rankin LABOR, CRNAPre-anesthesia Checklist: Patient identified, Patient being monitored, Timeout performed, Emergency Drugs available and Suction available Patient Re-evaluated:Patient Re-evaluated prior to induction Oxygen Delivery Method: Circle System Utilized Preoxygenation: Pre-oxygenation with 100% oxygen Induction Type: IV induction and Rapid sequence Laryngoscope Size: Mac, 3 and McGrath Grade View: Grade I Tube type: Oral Tube size: 7.5 mm Number of attempts: 1 Airway Equipment and Method: Stylet Placement Confirmation: ETT inserted through vocal cords under direct vision, positive ETCO2 and breath sounds checked- equal and bilateral Secured at: 20 cm Tube secured with: Tape Dental Injury: Teeth and Oropharynx as per pre-operative assessment

## 2023-12-31 NOTE — Op Note (Addendum)
 Preoperative diagnosis:  Left nephrolithiasis Bladder cancer   Postoperative diagnosis:  Left nephrolithiasis Left UPJO obstruction   Procedure:  Cystoscopy left ureteroscopy with laser lithotripsy left retrograde pyelography with interpretation  Left ureteral balloon dilation of stricture (UPJO) left ureteral stent placement  Surgeon: Penne Skye, MD  Anesthesia: General  Complications: None  Intraoperative findings: Pathologic narrowing of left UPJO, required balloon dilation to 66F Successful laser lithotripsy of large 1.5 cm left renal pelvic stone Medial partial thickness tearing of left UPJO following dilation and subsequent basket attempts - elected to abort further basketing and place indwelling stent  EBL: Minimal  Specimens:  Left renal pelvic stone  Indication: Chelsea Burke is a 78 y.o. 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 We have discussed the potential benefits and risks of the procedure, side effects of the proposed treatment, the likelihood of the patient achieving the goals of the procedure, and any potential problems that might occur during the procedure or recuperation. Informed consent has been obtained.  Description of procedure:  The patient was taken to the operating room and general anesthesia was induced.  The patient was placed in the dorsal lithotomy position, prepped and draped in the usual sterile fashion, and preoperative antibiotics were administered. A preoperative time-out was performed.   Cystourethroscopy was performed.  The patient's urethra was examined and was normal. The bladder was then systematically examined in its entirety. There was no evidence for any bladder tumors, stones, or other mucosal  pathology. This completed her previously scheduled surveillance cystoscopy for LG bladder cancer. Bilateral ureteral orifices noted in orthotopic location.  Attention then turned to the left ureteral orifice and a ureteral catheter was used to intubate the ureteral orifice.  Omnipaque  contrast was injected through the ureteral catheter and a retrograde pyelogram was performed with findings: tight narrow waist at [facility] with immediate dilation proximal, renal pelvic stone appeared to be high in renal pelvis.   A sensor guidewire was then advanced up the left ureter into the renal pelvis under fluoroscopic guidance. Next, we elected to place a ureteral access sheath.  This was placed under direct fluoroscopic guidance. We transitioned to a flexible ureteroscope which was passed carefully to the UPJ at which point a pathologic narrowing was noted. Fully concentric stenotic ring ~26F in caliber, unable to traverse with ureteroscope. Segment was very short, only a ring in nature at UPJ. We elected to dilate with a 4 cm x 66F ureteral balloon dilator under live fluoroscopic guidance. Post-dilation ureteroscopy demonstrated successful concentric widening and easy passage of ureteroscope. Stone burden was encountered at the renal pelvis consistent with preoperative imaging.  A 365 m holmium laser was used to fully treat the stone with laser lithotripsy.   Next, a zero-tip wire basket was introduced and all sizeable stone fragments were extracted. Of note, during our basket extraction, we noted persistent strain on medial Right UPJ at location of dilation, with some evidence of partial wall tearing. A pull back RPG demonstrated mild medial contrast extravasation at this location. As such, we aborted continued basket extraction and elected to place an indwelling stent. We replaced our wire via the scope with direct visualization into to the renal pelvis.  We elected to place a temporary indwelling stent without a  string.  Our sensor guidewire was replaced and a 35F x 24 cm JJ stent , which was placed via fluoroscopic guidance. A spot KUB confirmed appropriate proximal and distal curl locations.   The bladder was then emptied and the procedure ended.  The patient appeared to tolerate the procedure well and without complications.  The patient was able to be awakened and transferred to the recovery unit in satisfactory condition.   Plan: - return to clinic in 2 weeks for cystoscopic stent removal - She may have some very small residual stones, although ~80-90% were fully dusted. We will decide the pros/cons of 2nd look URS vs expectant surveillance- would likely prefer surveillance   Penne Skye, M.D.

## 2024-01-01 ENCOUNTER — Encounter: Payer: Self-pay | Admitting: Urology

## 2024-01-06 NOTE — Progress Notes (Signed)
   01/15/2024 7:28 AM   Chelsea Burke 05/03/1945 [id number]  Cystoscopy Procedure Note:  Indication:  S/p Left URS/LL + left UPJ balloon dilation on 12/31/23   - indwelling stent placed x 2 weeks    - E.coli UTI (01/09/24)-  completing Cipro  course, last dose today  After informed consent and discussion of the procedure and its risks, SHEYANNE MUNLEY was positioned and prepped in the standard fashion. Cystoscopy was performed with a flexible cystoscope. The external vaginal anatomy, urethra, bladder neck and bladder mucosa were visualized in a systematic fashion. The ureteral orifices were noted in orthotopic location and orientation. There were no bladder mucosal lesions, stones, debris or anatomic variants noted. The left ureteral stent was visualized emanating from the left UO. The stent was controlled with flexible graspers and fully externalized, wholly intact.   Imaging: N/A  Findings: Successful Left ureteral stent removal   Assessment and Plan: - Follow up in ~6-8 weeks with RBUS prior, symptom check, Left UPJO evaluation, stone prevention management  Penne Skye, MD 01/06/2024

## 2024-01-09 ENCOUNTER — Ambulatory Visit
Admission: EM | Admit: 2024-01-09 | Discharge: 2024-01-09 | Disposition: A | Discharge status: Home / Self Care | Attending: Emergency Medicine | Admitting: Emergency Medicine

## 2024-01-09 ENCOUNTER — Encounter: Payer: Self-pay | Admitting: Emergency Medicine

## 2024-01-09 DIAGNOSIS — R319 Hematuria, unspecified: Secondary | ICD-10-CM | POA: Diagnosis not present

## 2024-01-09 DIAGNOSIS — N390 Urinary tract infection, site not specified: Secondary | ICD-10-CM | POA: Insufficient documentation

## 2024-01-09 DIAGNOSIS — R509 Fever, unspecified: Secondary | ICD-10-CM | POA: Insufficient documentation

## 2024-01-09 LAB — URINALYSIS, W/ REFLEX TO CULTURE (INFECTION SUSPECTED)
Bilirubin Urine: NEGATIVE
WBC, UA: >50 WBC/hpf (ref 0–5)

## 2024-01-09 LAB — CBC WITH DIFFERENTIAL/PLATELET
Abs Immature Granulocytes: 0.07 K/uL (ref 0.00–0.07)
Basophils Absolute: 0.1 K/uL (ref 0.0–0.1)
Basophils Relative: 0 %
Eosinophils Absolute: 0 K/uL (ref 0.0–0.5)
Eosinophils Relative: 0 %
HCT: 38.9 % (ref 36.0–46.0)
Hemoglobin: 13.3 g/dL (ref 12.0–15.0)
Immature Granulocytes: 1 %
Lymphocytes Relative: 4 %
Lymphs Abs: 0.5 K/uL — ABNORMAL LOW (ref 0.7–4.0)
MCH: 30.8 pg (ref 26.0–34.0)
MCHC: 34.2 g/dL (ref 30.0–36.0)
MCV: 90 fL (ref 80.0–100.0)
Monocytes Absolute: 1.2 K/uL — ABNORMAL HIGH (ref 0.1–1.0)
Monocytes Relative: 8 %
Neutro Abs: 12.2 K/uL — ABNORMAL HIGH (ref 1.7–7.7)
Neutrophils Relative %: 87 %
Platelets: 229 K/uL (ref 150–400)
RBC: 4.32 MIL/uL (ref 3.87–5.11)
RDW: 13.3 % (ref 11.5–15.5)
WBC: 14 K/uL — ABNORMAL HIGH (ref 4.0–10.5)
nRBC: 0 % (ref 0.0–0.2)

## 2024-01-09 LAB — BASIC METABOLIC PANEL WITH GFR
Anion gap: 12 (ref 5–15)
BUN: 11 mg/dL (ref 8–23)
CO2: 23 mmol/L (ref 22–32)
Calcium: 8.8 mg/dL — ABNORMAL LOW (ref 8.9–10.3)
Chloride: 100 mmol/L (ref 98–111)
Creatinine, Ser: 0.82 mg/dL (ref 0.44–1.00)
GFR, Estimated: >60 mL/min (ref >60)
Glucose, Bld: 131 mg/dL — ABNORMAL HIGH (ref 70–99)
Potassium: 3.8 mmol/L (ref 3.5–5.1)
Sodium: 135 mmol/L (ref 135–145)

## 2024-01-09 LAB — RESP PANEL BY RT-PCR (FLU A&B, COVID) ARPGX2
Influenza A by PCR: NEGATIVE
Influenza B by PCR: NEGATIVE
SARS Coronavirus 2 by RT PCR: NEGATIVE

## 2024-01-09 MED ORDER — ONDANSETRON 4 MG PO TBDP: 4.0000 mg | ORAL_TABLET | Freq: Three times a day (TID) | ORAL | 0 refills | Status: AC | PRN

## 2024-01-09 MED ORDER — CIPROFLOXACIN HCL 500 MG PO TABS: 500.0000 mg | ORAL_TABLET | Freq: Two times a day (BID) | ORAL | 0 refills | Status: AC

## 2024-01-09 MED ORDER — ONDANSETRON 4 MG PO TBDP
4.0000 mg | ORAL_TABLET | Freq: Once | ORAL | Status: AC
  Administered 2024-01-09: 4 mg via ORAL

## 2024-01-09 NOTE — ED Provider Notes (Signed)
 [facility]    CSN: [id number] Arrival date & time: 01/09/24  0801      History   Chief Complaint Chief Complaint  Patient presents with   Urinary Frequency   Sore Throat    HPI Chelsea Burke is a 78 y.o. female.   HPI  78 year old female with past medical history significant for benign essential hypertension, hyperlipidemia, GERD, asthma, depression, arthritis, and is 9 days status post left ureteral stent placement secondary to nephrolithiasis.  Has been experiencing bloody urine along with urinary frequency and low back pain.  Additionally, she is reporting sore throat, dry mouth, postnasal drip, dizziness upon standing, and nausea.  Past Medical History:  Diagnosis Date   Arthritis    Asthma    Atherosclerosis of abdominal aorta    Benign essential hypertension    Cancer (HCC)    bladder cancer   Cataract cortical, senile    Depression    Dyspnea    Fatty liver    GERD (gastroesophageal reflux disease)    History of hiatal hernia    History of kidney stones    Hyperlipidemia    Lesion of bladder 03/2022   Mixed hyperlipidemia    Morbid obesity with BMI of 40.0-44.9, adult (HCC)    Pneumonia    Sleep apnea    no longer uses cpap    Patient Active Problem List   Diagnosis Date Noted   Nephrolithiasis 12/18/2023   Gross hematuria 11/27/2023   History of bladder cancer 11/25/2023   Obesity, Class III, BMI 40-49.9 (morbid obesity) (HCC) 10/10/2018   History of Nissen fundoplication 10/10/2018   Mixed hyperlipidemia 10/10/2018   H/O vaginal hysterectomy 10/10/2018   GERD (gastroesophageal reflux disease) 10/10/2018   Gall bladder stones 10/10/2018   Depression 10/10/2018   Arthritis of knee 10/10/2018   Venous insufficiency of both lower extremities 08/29/2017   History of adenomatous polyp of colon 05/31/2017   Benign essential hypertension 08/27/2014   Fatty (change of) liver, not elsewhere classified 05/26/2013    Past Surgical History:   Procedure Laterality Date   BREAST BIOPSY Bilateral 03/2006   bil bx/clips-neg   CARPAL TUNNEL RELEASE Right    CHOLECYSTECTOMY     COLONOSCOPY     2002, 2007, 2014   COLONOSCOPY WITH PROPOFOL  N/A 07/19/2017   Procedure: COLONOSCOPY WITH PROPOFOL ;  Surgeon: Viktoria Lamar DASEN, MD;  Location: [facility] ENDOSCOPY;  Service: Endoscopy;  Laterality: N/A;   CYSTOSCOPY W/ RETROGRADES Bilateral 04/30/2022   Procedure: CYSTOSCOPY WITH RETROGRADE PYELOGRAM;  Surgeon: Penne Knee, MD;  Location: [facility] ORS;  Service: Urology;  Laterality: Bilateral;   CYSTOSCOPY WITH BIOPSY N/A 04/30/2022   Procedure: CYSTOSCOPY WITH BLADDER BIOPSY;  Surgeon: Penne Knee, MD;  Location: [facility] ORS;  Service: Urology;  Laterality: N/A;   CYSTOSCOPY WITH URETHRAL DILATATION Right 12/31/2023   Procedure: CYSTOSCOPY, WITH URETERAL DILATION;  Surgeon: Georganne Penne SAUNDERS, MD;  Location: [facility] ORS;  Service: Urology;  Laterality: Right;   CYSTOSCOPY/URETEROSCOPY/HOLMIUM LASER/STENT PLACEMENT Left 12/31/2023   Procedure: CYSTOSCOPY/URETEROSCOPY/HOLMIUM LASER/STENT PLACEMENT;  Surgeon: Georganne Penne SAUNDERS, MD;  Location: [facility] ORS;  Service: Urology;  Laterality: Left;   ESOPHAGOGASTRODUODENOSCOPY     1990, 1993, 2014   ESOPHAGOGASTRODUODENOSCOPY (EGD) WITH PROPOFOL  N/A 07/19/2017   Procedure: ESOPHAGOGASTRODUODENOSCOPY (EGD) WITH PROPOFOL ;  Surgeon: Viktoria Lamar DASEN, MD;  Location: [facility] ENDOSCOPY;  Service: Endoscopy;  Laterality: N/A;   KNEE ARTHROSCOPY Right    NASAL SINUS SURGERY     NISSEN FUNDOPLICATION  10/10/2018   TOTAL KNEE  ARTHROPLASTY Bilateral    VAGINAL HYSTERECTOMY  1970    OB History   No obstetric history on file.      Home Medications    Prior to Admission medications   Medication Sig Start Date End Date Taking? Authorizing Provider  ciprofloxacin  (CIPRO ) 500 MG tablet Take 1 tablet (500 mg total) by mouth 2 (two) times daily for 7 days. 01/09/24 01/16/24 Yes Bernardino Ditch, NP  ondansetron  (ZOFRAN -ODT)  4 MG disintegrating tablet Take 1 tablet (4 mg total) by mouth every 8 (eight) hours as needed for nausea or vomiting. 01/09/24  Yes Bernardino Ditch, NP  acetaminophen (TYLENOL) 325 MG tablet Take 650 mg by mouth in the morning.    [provider]  albuterol (VENTOLIN HFA) 108 (90 Base) MCG/ACT inhaler Inhale 2 puffs into the lungs every 6 (six) hours as needed for wheezing or shortness of breath. 07/19/22   [provider]  ascorbic acid (VITAMIN C) 500 MG tablet Take 500 mg by mouth in the morning.    [provider]  atorvastatin (LIPITOR) 20 MG tablet Take 20 mg by mouth at bedtime. 03/20/22   [provider]  Cholecalciferol (VITAMIN D3) 50 MCG (2000 UT) capsule Take 2,000 Units by mouth in the morning.    [provider]  diphenhydramine -acetaminophen (TYLENOL PM) 25-500 MG TABS tablet Take 1 tablet by mouth at bedtime.    [provider]  DULoxetine (CYMBALTA) 60 MG capsule Take 60 mg by mouth in the morning.    [provider]  Lutein 20 MG CAPS Take 20 mg by mouth at bedtime.    [provider]  telmisartan (MICARDIS) 80 MG tablet Take 80 mg by mouth at bedtime. 03/01/20   [provider]  traZODone (DESYREL) 50 MG tablet Take 50 mg by mouth at bedtime.    [provider]  vitamin E 180 MG (400 UNITS) capsule Take 400 Units by mouth at bedtime.    [provider]    Family History History reviewed. No pertinent family history.  Social History Social History   Tobacco Use   Smoking status: Never    Passive exposure: Never   Smokeless tobacco: Never  Vaping Use   Vaping status: Never Used  Substance Use Topics   Alcohol use: No   Drug use: No     Allergies   Doxycycline, Keflex [cephalexin], Norvasc [amlodipine], Penicillins, and Sulfa antibiotics   Review of Systems Review of Systems  Constitutional:  Positive for fever.  HENT:  Positive for congestion and postnasal drip.  Negative for ear pain, rhinorrhea and sore throat.   Respiratory:  Positive for cough. Negative for shortness of breath and wheezing.   Gastrointestinal:  Positive for nausea. Negative for vomiting.  Genitourinary:  Positive for frequency, hematuria and urgency.  Musculoskeletal:  Positive for back pain.  Neurological:  Positive for dizziness.     Physical Exam Triage Vital Signs ED Triage Vitals  Encounter Vitals Group     BP      Girls Systolic BP Percentile      Girls Diastolic BP Percentile      Boys Systolic BP Percentile      Boys Diastolic BP Percentile      Pulse      Resp      Temp      Temp src      SpO2      Weight      Height      Head Circumference  Peak Flow      Pain Score      Pain Loc      Pain Education      Exclude from Growth Chart    No data found.  Updated Vital Signs BP 126/76 (BP Location: Right Arm)   Pulse 78   Temp 99 F (37.2 C) (Oral)   Resp 16   SpO2 93%   Visual Acuity Right Eye Distance:   Left Eye Distance:   Bilateral Distance:    Right Eye Near:   Left Eye Near:    Bilateral Near:     Physical Exam Vitals and nursing note reviewed.  Constitutional:      Appearance: Normal appearance. She is ill-appearing.  HENT:     Head: Normocephalic and atraumatic.     Right Ear: Tympanic membrane, ear canal and external ear normal. There is no impacted cerumen.     Left Ear: Tympanic membrane, ear canal and external ear normal. There is no impacted cerumen.     Nose: Nose normal. No congestion or rhinorrhea.     Mouth/Throat:     Mouth: Mucous membranes are dry.     Pharynx: Posterior oropharyngeal erythema present. No oropharyngeal exudate.     Comments: Lips and oral mucous membranes are dry.  Mild erythema to the posterior oropharynx. Cardiovascular:     Rate and Rhythm: Normal rate and regular rhythm.     Pulses: Normal pulses.     Heart sounds: Normal heart sounds. No murmur heard.    No friction rub. No gallop.   Pulmonary:     Effort: Pulmonary effort is normal.     Breath sounds: Normal breath sounds. No wheezing, rhonchi or rales.  Musculoskeletal:     Cervical back: Normal range of motion and neck supple. No tenderness.  Lymphadenopathy:     Cervical: No cervical adenopathy.  Skin:    General: Skin is warm and dry.     Capillary Refill: Capillary refill takes less than 2 seconds.     Findings: No erythema or rash.  Neurological:     Mental Status: She is alert.      UC Treatments / Results  Labs (all labs ordered are listed, but only abnormal results are displayed) Labs Reviewed  URINALYSIS, W/ REFLEX TO CULTURE (INFECTION SUSPECTED) - Abnormal; Notable for the following components:      Result Value   Color, Urine ORANGE (*)    APPearance CLOUDY (*)    Glucose, UA   (*)    Value: TEST NOT REPORTED DUE TO COLOR INTERFERENCE OF URINE PIGMENT   Hgb urine dipstick   (*)    Value: TEST NOT REPORTED DUE TO COLOR INTERFERENCE OF URINE PIGMENT   Ketones, ur   (*)    Value: TEST NOT REPORTED DUE TO COLOR INTERFERENCE OF URINE PIGMENT   Protein, ur   (*)    Value: TEST NOT REPORTED DUE TO COLOR INTERFERENCE OF URINE PIGMENT   Nitrite   (*)    Value: TEST NOT REPORTED DUE TO COLOR INTERFERENCE OF URINE PIGMENT   Leukocytes,Ua   (*)    Value: TEST NOT REPORTED DUE TO COLOR INTERFERENCE OF URINE PIGMENT   Bacteria, UA MANY (*)    All other components within normal limits  CBC WITH DIFFERENTIAL/PLATELET - Abnormal; Notable for the following components:   WBC 14.0 (*)    Neutro Abs 12.2 (*)    Lymphs Abs 0.5 (*)    Monocytes Absolute 1.2 (*)  All other components within normal limits  BASIC METABOLIC PANEL WITH GFR - Abnormal; Notable for the following components:   Glucose, Bld 131 (*)    Calcium 8.8 (*)    All other components within normal limits  RESP PANEL BY RT-PCR (FLU A&B, COVID) [id number]  URINE CULTURE    EKG   Radiology No results found.  Procedures Procedures  (including critical care time)  Medications Ordered in UC Medications  ondansetron  (ZOFRAN -ODT) disintegrating tablet 4 mg (4 mg Oral Given 01/09/24 0849)    Initial Impression / Assessment and Plan / UC Course  I have reviewed the triage vital signs and the nursing notes.  Pertinent labs & imaging results that were available during my care of the patient were reviewed by me and considered in my medical decision making (see chart for details).   Patient is a pleasant, though mildly ill-appearing 78 year old female presenting for evaluation of respiratory and urinary symptoms.  She had a left ureteral stent for kidney stone placed 9 days ago and has had continued hematuria.  She also endorses urgency, frequency, and nocturia.  She has taken Azo to help with her symptoms without any significant improvement.  She is also reporting dry mouth, sore throat, sinus drainage, chills, subjective fever last night, and dizziness when standing as well as nausea and dry heaves but no vomiting.  She is status post Nissen fundoplication.  Patient's physical exam does reveal dry oral mucous membranes and mild erythema to the posterior pharynx.  The remainder upper respiratory exam is benign.  No cervical lymphadenopathy present.  Cardiopulmonary exam reveals good lung sounds all fields.  Given that she has had hematuria and has a ureteral stent placed she is at increased risk for UTI and urosepsis so I will order a urinalysis along with a CBC and a BMP to evaluate for infection and electrolyte abnormalities.  Additionally, since she was recently in the hospital and is experiencing upper respiratory symptoms I will order a COVID and flu PCR.  I have also ordered 4 mg of Zofran  for the patient nausea and I will have staff attempt a p.o. challenge in approximately 20 minutes to ensure the patient can drink fluids prior to any consideration of discharge.  CBC shows a mildly elevated white blood cell count of 14.0.  H&H are  normal at 13.3 and 38.9 respectively.  Platelets are normal at 222.  Elevated neutrophil number of 12.2, decreased absolute lymphocytes at 0.5 increased monocytes at 1.2.  BMP shows normal electrolytes, mildly elevated glucose at 131, normal renal function.  Mildly low calcium at 8.8.  Urinalysis is orange color with cloudy appearance.  Dip was not performed secondary to colorimetric interference.  Reflex microscopy shows greater than 50 WBCs, 11-20 RBCs, and many bacteria.  Respiratory panel is negative for COVID or influenza.  I will discharge patient on the diagnosis of UTI.  She has multiple medication she is to include anaphylaxis to doxycycline, rash with sulfa, penicillins, and Keflex.  Tolerated her p.o. challenge.  I will therefore discharge her home on ciprofloxacin  500 mg twice daily for 7 days for treatment of her UTI.  I will also prescribe Zofran  that she can use every 8 hours as needed for nausea.  I suspect that her intermittent dizziness is secondary to poor fluid intake given her dry oral mucous membranes, dry skin, and poor skin turgor.   Final Clinical Impressions(s) / UC Diagnoses   Final diagnoses:  Urinary tract infection with hematuria, site unspecified  Discharge Instructions      Your COVID test was negative but your blood work did show that you have an elevated white blood cell count indicating that you do have an infection.  No evidence of electrolyte abnormalities or impact to your kidneys.  Your urine did show that you have a significant infection.  Take the Cipro  500 mg twice daily with food for 7 days for treatment of your urinary tract infection.  You may continue to use Azo every 8 hours as needed for urinary discomfort.  I am also to prescribe you Zofran  which you can take every 8 hours as needed for nausea.  If you develop any increase in your dizziness, have fainting spells, develop fevers, worsening nausea, or shaking chills you need to go to  the ER.     ED Prescriptions     Medication Sig Dispense Auth. Provider   ciprofloxacin  (CIPRO ) 500 MG tablet Take 1 tablet (500 mg total) by mouth 2 (two) times daily for 7 days. 14 tablet Bernardino Ditch, NP   ondansetron  (ZOFRAN -ODT) 4 MG disintegrating tablet Take 1 tablet (4 mg total) by mouth every 8 (eight) hours as needed for nausea or vomiting. 20 tablet Bernardino Ditch, NP      PDMP not reviewed this encounter.   Bernardino Ditch, NP 01/09/24 [phone]

## 2024-01-09 NOTE — ED Triage Notes (Signed)
 Sore throat, dry mouth, sinus drainage, chills, dizziness when standing and nausea, frequent urination, lower back pain that started last night.

## 2024-01-09 NOTE — ED Notes (Signed)
 Fluid challenge  Pt was able to drink 14 oz of water . Re-checked at 10 minuets and pt had no vomiting or nausea.

## 2024-01-09 NOTE — Discharge Instructions (Signed)
 Your COVID test was negative but your blood work did show that you have an elevated white blood cell count indicating that you do have an infection.  No evidence of electrolyte abnormalities or impact to your kidneys.  Your urine did show that you have a significant infection.  Take the Cipro  500 mg twice daily with food for 7 days for treatment of your urinary tract infection.  You may continue to use Azo every 8 hours as needed for urinary discomfort.  I am also to prescribe you Zofran  which you can take every 8 hours as needed for nausea.  If you develop any increase in your dizziness, have fainting spells, develop fevers, worsening nausea, or shaking chills you need to go to the ER.

## 2024-01-10 LAB — STONE ANALYSIS
Calcium Oxalate Dihydrate: 20 %
Calcium Oxalate Monohydrate: 80 %
Weight Calculi: 21 mg

## 2024-01-11 LAB — URINE CULTURE: Culture: >=100000 — AB

## 2024-01-13 ENCOUNTER — Ambulatory Visit (HOSPITAL_COMMUNITY): Payer: Self-pay

## 2024-01-15 ENCOUNTER — Ambulatory Visit (INDEPENDENT_AMBULATORY_CARE_PROVIDER_SITE_OTHER): Admitting: Urology

## 2024-01-15 ENCOUNTER — Other Ambulatory Visit
Admission: RE | Admit: 2024-01-15 | Discharge: 2024-01-15 | Disposition: A | Discharge status: Home / Self Care | Attending: Urology | Admitting: Urology

## 2024-01-15 VITALS — BP 178/84 | HR 78

## 2024-01-15 DIAGNOSIS — Z6841 Body Mass Index (BMI) 40.0 and over, adult: Secondary | ICD-10-CM | POA: Diagnosis not present

## 2024-01-15 DIAGNOSIS — N200 Calculus of kidney: Secondary | ICD-10-CM | POA: Insufficient documentation

## 2024-01-15 DIAGNOSIS — E782 Mixed hyperlipidemia: Secondary | ICD-10-CM | POA: Diagnosis not present

## 2024-01-15 DIAGNOSIS — I10 Essential (primary) hypertension: Secondary | ICD-10-CM | POA: Diagnosis not present

## 2024-01-15 DIAGNOSIS — I4510 Unspecified right bundle-branch block: Secondary | ICD-10-CM | POA: Diagnosis not present

## 2024-01-15 LAB — URINALYSIS, COMPLETE (UACMP) WITH MICROSCOPIC
Bilirubin Urine: NEGATIVE
Glucose, UA: NEGATIVE mg/dL
Ketones, ur: NEGATIVE mg/dL
Nitrite: NEGATIVE
Protein, ur: 30 mg/dL — AB
Specific Gravity, Urine: >1.03 — ABNORMAL HIGH (ref 1.005–1.030)
pH: 5.5 (ref 5.0–8.0)

## 2024-01-15 MED ORDER — LIDOCAINE HCL URETHRAL/MUCOSAL 2 % EX GEL
1.0000 | Freq: Once | CUTANEOUS | Status: AC
  Administered 2024-01-15: 1 via URETHRAL

## 2024-01-15 NOTE — Patient Instructions (Signed)
 Please contact Central Scheduling to set up your ultrasound at [phone].

## 2024-01-27 ENCOUNTER — Ambulatory Visit
Admission: RE | Admit: 2024-01-27 | Discharge: 2024-01-27 | Disposition: A | Discharge status: Home / Self Care | Source: Ambulatory Visit | Attending: Urology | Admitting: Urology

## 2024-01-27 DIAGNOSIS — N2889 Other specified disorders of kidney and ureter: Secondary | ICD-10-CM | POA: Diagnosis not present

## 2024-01-27 DIAGNOSIS — N200 Calculus of kidney: Secondary | ICD-10-CM | POA: Diagnosis not present

## 2024-01-27 DIAGNOSIS — N132 Hydronephrosis with renal and ureteral calculous obstruction: Secondary | ICD-10-CM | POA: Diagnosis not present

## 2024-01-29 ENCOUNTER — Ambulatory Visit: Admitting: Urology

## 2024-02-26 ENCOUNTER — Ambulatory Visit: Admitting: Urology

## 2024-02-28 DIAGNOSIS — N135 Crossing vessel and stricture of ureter without hydronephrosis: Secondary | ICD-10-CM | POA: Insufficient documentation

## 2024-02-28 NOTE — Assessment & Plan Note (Deleted)
 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

## 2024-02-28 NOTE — Assessment & Plan Note (Deleted)
 Left UPJO w/ nephrolithiasis  - Recently symptomatic, likely secondary to partially obstructive UPJ stone  - s/p UPJ balloon dilation 12/31/23  Would favor close expectant management at this point.  We need to carefully monitor recurrent nephrolithiasis proximal to her UPJ, as well as any new flank pain or clinical changes.  She does have challenging anatomy and I would maintain a high threshold to repeat endoscopic surgery.   - As above, will collect RBUS in 6-8 months and evaluate both

## 2024-02-28 NOTE — Assessment & Plan Note (Deleted)
 1.5 cm Left UPJ stone, mild hydronephrosis  - s/p Left URS/LL (12/31/23) - limited by UPJO, unable to fully basket all fragments  - RBUS (01/29/24) - 13mm LLP stone (likely layering fragments)  - CaOx mono 80%, CaOX di 20%  Ultimately I think a reasonable outcome from her surgery.  Unfortunately she has challenging endoscopic anatomy considering her UPJO, and we were unable to fully basket all fragments.  I would maintain a high threshold to repeat endoscopic surgery in favor expectant management at this point.   - RBUS in 6-8 months with KUB, return visit

## 2024-02-28 NOTE — Progress Notes (Deleted)
° °  03/04/2024 9:28 AM   Chelsea Burke 1946/01/14 [id number]  Reason for visit: Follow up nephrolithiasis, Left UPJO, bladder Ca   HPI: 78 y.o. 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   Constitutional:  Alert and oriented, No acute distress. GU: ***  Laboratory Data: Calcium Oxalate Monohydrate 80 VC  Calcium Oxalate Dihydrate 20 VC    Pertinent Imaging: I have personally viewed and interpreted the RBUS (01/29/24) -  IMPRESSION: 1. Left-sided nephrolithiasis, largest of which measures 13 mm at the lower pole. 2. Left renal pelviectasis with lower pole caliectasis, similar to prior. 3. Normal sonographic appearance of the right kidney..  CTU (12/05/23) -  1. There is a 1.3 x 1.5 cm calculus in the left renal pelvis and a 5 x 5 mm nonobstructing calculus in the right kidney lower pole calyx. No other nephroureterolithiasis or obstructive uropathy on either side. 2. No focal residual bladder mass seen. No metastatic disease identified within the abdomen or pelvis.       Assessment & Plan:    Nephrolithiasis Assessment & Plan: 1.5 cm Left UPJ stone, mild hydronephrosis  - s/p Left URS/LL (12/31/23) - limited by UPJO, unable to fully basket all fragments  - RBUS (01/29/24) - 13mm LLP stone (likely layering fragments)  - CaOx mono 80%, CaOX di 20%  Ultimately I think a reasonable outcome from her surgery.  Unfortunately she has challenging endoscopic anatomy considering her UPJO, and we were  unable to fully basket all fragments.  I would maintain a high threshold to repeat endoscopic surgery in favor expectant management at this point.   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  We need to carefully monitor recurrent nephrolithiasis proximal to her UPJ, as well as any new flank pain or clinical changes.  She does have challenging anatomy and I would maintain a high threshold to repeat endoscopic surgery.   - As above, will collect RBUS in 6-8 months and evaluate both        [name], MD  [facility] [address], [address] [place], [place] [address] [phone]

## 2024-03-04 ENCOUNTER — Ambulatory Visit: Admitting: Urology

## 2024-03-25 NOTE — Progress Notes (Signed)
 "  03/30/2024 11:13 AM   Chelsea Burke 06-28-1945 [id number]  Reason for visit: Follow up nephrolithiasis   HPI: 79 y.o. 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  Laboratory Data: Component Ref Range & Units (hover) 2 mo ago  Source Comment VC  Comment: Not provided CORRECTED ON 10/24 AT 1835: PREVIOUSLY REPORTED AS KIDNEY STONE  Color Calculi Tan  Size Calculi 3x3  Comment: (NOTE) Multiple pieces received.  Dimensions of the largest piece reported.  Weight Calculi 21 VC  Calcium Oxalate Monohydrate 80 VC  Calcium Oxalate Dihydrate 20 VC  Calculus Received Wet Comment VC    Pertinent Imaging: I have personally viewed and interpreted the RBUS (03/29/2023)-generally stable left pelviectasis, perhaps mild left hydronephrosis.  Large prior left renal pelvic stone now fragmented, several moderate-sized pieces occupying left lower pole.  Normal contralateral right kidney.    Assessment & Plan:    Nephrolithiasis Assessment & Plan: 1.5 cm Left UPJ stone, mild hydronephrosis  - s/p Left URS/LL (12/31/23) - limited by UPJO, unable to fully basket all fragments  - RBUS (01/29/24) - 13mm LLP stone (likely layering fragments)  - CaOx mono 80%, CaOX di 20%  Ultimately I think a reasonable outcome from her surgery.  Unfortunately she has challenging endoscopic anatomy considering  her UPJO, and we were unable to fully basket all fragments.  I would maintain a high threshold to repeat endoscopic surgery in favor expectant management at this point.   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  We need to carefully monitor recurrent nephrolithiasis proximal to her UPJ, as well as any new flank pain or clinical changes.  She does have challenging anatomy and I would maintain a high threshold to repeat endoscopic surgery.   - As above, will collect RBUS in 6-8 months and evaluate both   Overactive bladder Assessment & Plan: Chronic OAB + UUI  Today, we reviewed the diagnosis of female overactive bladder (OAB) which is a very common issue with multifactorial etiologies. This is typically a benign condition, but can cause significant distress and reduced quality of life. Initial management emphasizes conservative therapies: bladder training, timed voiding, fluid/caffeine modification, pelvic floor muscle therapy, and behavioral strategies, with pharmacologic or procedural options reserved for refractory cases.   - will start with a 1-mo trial of Gemtesa  - She will let me know if effective and may convert to formal Rx. 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

## 2024-03-25 NOTE — Assessment & Plan Note (Signed)
 Left UPJO w/ nephrolithiasis  - Recently symptomatic, likely secondary to partially obstructive UPJ stone  - s/p UPJ balloon dilation 12/31/23  Would favor close expectant management at this point.  We need to carefully monitor recurrent nephrolithiasis proximal to her UPJ, as well as any new flank pain or clinical changes.  She does have challenging anatomy and I would maintain a high threshold to repeat endoscopic surgery.   - As above, will collect RBUS in 6-8 months and evaluate both

## 2024-03-25 NOTE — Assessment & Plan Note (Signed)
 1.5 cm Left UPJ stone, mild hydronephrosis  - s/p Left URS/LL (12/31/23) - limited by UPJO, unable to fully basket all fragments  - RBUS (01/29/24) - 13mm LLP stone (likely layering fragments)  - CaOx mono 80%, CaOX di 20%  Ultimately I think a reasonable outcome from her surgery.  Unfortunately she has challenging endoscopic anatomy considering her UPJO, and we were unable to fully basket all fragments.  I would maintain a high threshold to repeat endoscopic surgery in favor expectant management at this point.   - RBUS in 6-8 months with KUB, return visit

## 2024-03-26 ENCOUNTER — Ambulatory Visit: Admitting: Urology

## 2024-03-30 ENCOUNTER — Ambulatory Visit (INDEPENDENT_AMBULATORY_CARE_PROVIDER_SITE_OTHER): Admitting: Urology

## 2024-03-30 VITALS — BP 138/70 | HR 74 | Ht 65.0 in | Wt 255.0 lb

## 2024-03-30 DIAGNOSIS — N135 Crossing vessel and stricture of ureter without hydronephrosis: Secondary | ICD-10-CM | POA: Diagnosis not present

## 2024-03-30 DIAGNOSIS — Z8551 Personal history of malignant neoplasm of bladder: Secondary | ICD-10-CM | POA: Diagnosis not present

## 2024-03-30 DIAGNOSIS — N3281 Overactive bladder: Secondary | ICD-10-CM | POA: Diagnosis not present

## 2024-03-30 DIAGNOSIS — N200 Calculus of kidney: Secondary | ICD-10-CM | POA: Diagnosis not present

## 2024-03-30 NOTE — Assessment & Plan Note (Addendum)
 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

## 2024-03-30 NOTE — Assessment & Plan Note (Signed)
 Chronic OAB + UUI  Today, we reviewed the diagnosis of female overactive bladder (OAB) which is a very common issue with multifactorial etiologies. This is typically a benign condition, but can cause significant distress and reduced quality of life. Initial management emphasizes conservative therapies: bladder training, timed voiding, fluid/caffeine modification, pelvic floor muscle therapy, and behavioral strategies, with pharmacologic or procedural options reserved for refractory cases.   - will start with a 1-mo trial of Gemtesa  - She will let me know if effective and may convert to formal Rx. Medication can be a costlier depending on insurance

## 2024-03-30 NOTE — Patient Instructions (Signed)
 Schedule renal u/s  [phone]

## 2024-04-06 ENCOUNTER — Ambulatory Visit

## 2024-05-04 ENCOUNTER — Ambulatory Visit

## 2024-06-03 ENCOUNTER — Other Ambulatory Visit: Admitting: Urology

## 2024-06-05 ENCOUNTER — Other Ambulatory Visit: Admitting: Urology
